# Patient Record
Sex: Male | Born: 1965 | Race: Black or African American | Hispanic: No | Marital: Married | Smoking: Former smoker
Health system: Southern US, Community
[De-identification: ages and names within clinical notes are randomized; demographics above are authoritative.]

## PROBLEM LIST (undated history)

## (undated) DIAGNOSIS — I10 Essential (primary) hypertension: Secondary | ICD-10-CM

## (undated) DIAGNOSIS — R519 Headache, unspecified: Secondary | ICD-10-CM

## (undated) DIAGNOSIS — E785 Hyperlipidemia, unspecified: Secondary | ICD-10-CM

## (undated) DIAGNOSIS — M542 Cervicalgia: Secondary | ICD-10-CM

## (undated) DIAGNOSIS — M549 Dorsalgia, unspecified: Secondary | ICD-10-CM

## (undated) DIAGNOSIS — K635 Polyp of colon: Secondary | ICD-10-CM

## (undated) DIAGNOSIS — E119 Type 2 diabetes mellitus without complications: Secondary | ICD-10-CM

## (undated) HISTORY — DX: Dorsalgia, unspecified: M54.9

## (undated) HISTORY — DX: Hyperlipidemia, unspecified: E78.5

## (undated) HISTORY — DX: Type 2 diabetes mellitus without complications: E11.9

## (undated) HISTORY — DX: Essential (primary) hypertension: I10

## (undated) HISTORY — PX: OTHER SURGICAL HISTORY: SHX169

## (undated) HISTORY — DX: Headache, unspecified: R51.9

## (undated) HISTORY — PX: COLONOSCOPY: SHX174

## (undated) HISTORY — DX: Polyp of colon: K63.5

## (undated) HISTORY — DX: Cervicalgia: M54.2

---

## 2004-05-24 ENCOUNTER — Encounter: Admission: RE | Admit: 2004-05-24 | Discharge: 2004-07-06 | Payer: Self-pay | Admitting: Internal Medicine

## 2012-10-02 DIAGNOSIS — E559 Vitamin D deficiency, unspecified: Secondary | ICD-10-CM | POA: Insufficient documentation

## 2013-01-09 DIAGNOSIS — R6882 Decreased libido: Secondary | ICD-10-CM | POA: Insufficient documentation

## 2013-01-13 DIAGNOSIS — E291 Testicular hypofunction: Secondary | ICD-10-CM | POA: Insufficient documentation

## 2013-01-15 DIAGNOSIS — E785 Hyperlipidemia, unspecified: Secondary | ICD-10-CM | POA: Insufficient documentation

## 2013-01-15 DIAGNOSIS — E1169 Type 2 diabetes mellitus with other specified complication: Secondary | ICD-10-CM | POA: Insufficient documentation

## 2013-01-15 DIAGNOSIS — I10 Essential (primary) hypertension: Secondary | ICD-10-CM | POA: Insufficient documentation

## 2013-01-15 DIAGNOSIS — Z720 Tobacco use: Secondary | ICD-10-CM | POA: Insufficient documentation

## 2013-06-25 DIAGNOSIS — N529 Male erectile dysfunction, unspecified: Secondary | ICD-10-CM | POA: Insufficient documentation

## 2014-10-20 DIAGNOSIS — M545 Low back pain, unspecified: Secondary | ICD-10-CM | POA: Insufficient documentation

## 2017-07-02 ENCOUNTER — Encounter: Payer: Self-pay | Admitting: Gastroenterology

## 2017-07-09 ENCOUNTER — Ambulatory Visit (AMBULATORY_SURGERY_CENTER): Payer: Self-pay

## 2017-07-09 VITALS — Ht 74.0 in | Wt 199.8 lb

## 2017-07-09 DIAGNOSIS — Z1211 Encounter for screening for malignant neoplasm of colon: Secondary | ICD-10-CM

## 2017-07-09 MED ORDER — NA SULFATE-K SULFATE-MG SULF 17.5-3.13-1.6 GM/177ML PO SOLN: 1.0000 | Freq: Once | ORAL | 0 refills | Status: AC

## 2017-07-09 NOTE — Progress Notes (Signed)
Denies allergies to eggs or soy products. Denies complication of anesthesia or sedation. Denies use of weight loss medication. Denies use of O2.   Emmi instructions given for colonoscopy.  

## 2017-07-10 ENCOUNTER — Encounter: Payer: Self-pay | Admitting: Gastroenterology

## 2017-07-24 ENCOUNTER — Ambulatory Visit (AMBULATORY_SURGERY_CENTER): Payer: 59 | Admitting: Gastroenterology

## 2017-07-24 ENCOUNTER — Encounter: Payer: Self-pay | Admitting: Gastroenterology

## 2017-07-24 VITALS — BP 110/69 | HR 71 | Temp 98.6°F | Resp 15 | Ht 74.0 in | Wt 199.0 lb

## 2017-07-24 DIAGNOSIS — D124 Benign neoplasm of descending colon: Secondary | ICD-10-CM | POA: Diagnosis not present

## 2017-07-24 DIAGNOSIS — Z1211 Encounter for screening for malignant neoplasm of colon: Secondary | ICD-10-CM

## 2017-07-24 MED ORDER — SODIUM CHLORIDE 0.9 % IV SOLN: 500.0000 mL | INTRAVENOUS | Status: DC

## 2017-07-24 NOTE — Progress Notes (Signed)
Report given to PACU, vss 

## 2017-07-24 NOTE — Op Note (Signed)
[place] Patient Name: Shown Dissinger Procedure Date: 07/24/2017 8:32 AM MRN: [id number] Endoscopist: Mallie Mussel L. Loletha Carrow , MD Age: 51 Referring MD:  Date of Birth: 11/01/1966 Gender: Male Account #: [id number] Procedure:                Colonoscopy Indications:              Screening for colorectal malignant neoplasm, This                            is the patient's first colonoscopy Medicines:                Monitored Anesthesia Care Procedure:                Pre-Anesthesia Assessment:                           - Prior to the procedure, a History and Physical                            was performed, and patient medications and                            allergies were reviewed. The patient's tolerance of                            previous anesthesia was also reviewed. The risks                            and benefits of the procedure and the sedation                            options and risks were discussed with the patient.                            All questions were answered, and informed consent                            was obtained. Prior Anticoagulants: The patient has                            taken no previous anticoagulant or antiplatelet                            agents. ASA Grade Assessment: II - A patient with                            mild systemic disease. After reviewing the risks                            and benefits, the patient was deemed in                            satisfactory condition to undergo the procedure.  After obtaining informed consent, the colonoscope                            was passed under direct vision. Throughout the                            procedure, the patient's blood pressure, pulse, and                            oxygen saturations were monitored continuously. The                            Colonoscope was introduced through the anus and                            advanced to the the  terminal ileum. The colonoscopy                            was performed without difficulty. The patient                            tolerated the procedure well. The quality of the                            bowel preparation was good. The terminal ileum,                            ileocecal valve, appendiceal orifice, and rectum                            were photographed. The quality of the bowel                            preparation was evaluated using the BBPS [facility]                            Bowel Preparation Scale) with scores of: Right                            Colon = 2, Transverse Colon = 2 and Left Colon = 2.                            The total BBPS score equals 6. The bowel                            preparation used was SUPREP. Scope In: 8:34:34 AM Scope Out: 8:51:30 AM Scope Withdrawal Time: 0 hours 14 minutes 44 seconds  Total Procedure Duration: 0 hours 16 minutes 56 seconds  Findings:                 The digital rectal exam findings include anal                            stricture. This  precluded complete prostate exam.                           The terminal ileum appeared normal.                           A 2 mm polyp was found in the proximal descending                            colon. The polyp was sessile. The polyp was removed                            with a cold snare. Resection and retrieval were                            complete.                           The exam was otherwise without abnormality on                            direct and retroflexion views. Complications:            No immediate complications. Estimated Blood Loss:     Estimated blood loss: none. Impression:               - Anal stricture found on digital rectal exam.                           - The examined portion of the ileum was normal.                           - One 2 mm polyp in the proximal descending colon,                            removed with a cold snare. Resected and  retrieved.                           - The examination was otherwise normal on direct                            and retroflexion views. Recommendation:           - Patient has a contact number available for                            emergencies. The signs and symptoms of potential                            delayed complications were discussed with the                            patient. Return to normal activities tomorrow.  Written discharge instructions were provided to the                            patient.                           - Resume previous diet.                           - Continue present medications.                           - Await pathology results.                           - Repeat colonoscopy is recommended for                            surveillance. The colonoscopy date will be                            determined after pathology results from today's                            exam become available for review. Henry L. Loletha Carrow, MD 07/24/2017 8:55:44 AM This report has been signed electronically.

## 2017-07-24 NOTE — Progress Notes (Signed)
Called to room to assist during endoscopic procedure.  Patient ID and intended procedure confirmed with present staff. Received instructions for my participation in the procedure from the performing physician.  

## 2017-07-24 NOTE — Progress Notes (Signed)
Pt's states no medical or surgical changes since previsit or office visit. 

## 2017-07-24 NOTE — Patient Instructions (Signed)
Impression/Recommendations:  Polyp handout given to patient,  Resume previous diet. Continue present medications.  Repeat colonoscopy recommended for surveillance.  Date to be determined based on pathology results.  YOU HAD AN ENDOSCOPIC PROCEDURE TODAY AT [facility]:   Refer to the procedure report that was given to you for any specific questions about what was found during the examination.  If the procedure report does not answer your questions, please call your gastroenterologist to clarify.  If you requested that your care partner not be given the details of your procedure findings, then the procedure report has been included in a sealed envelope for you to review at your convenience later.  YOU SHOULD EXPECT: Some feelings of bloating in the abdomen. Passage of more gas than usual.  Walking can help get rid of the air that was put into your GI tract during the procedure and reduce the bloating. If you had a lower endoscopy (such as a colonoscopy or flexible sigmoidoscopy) you may notice spotting of blood in your stool or on the toilet paper. If you underwent a bowel prep for your procedure, you may not have a normal bowel movement for a few days.  Please Note:  You might notice some irritation and congestion in your nose or some drainage.  This is from the oxygen used during your procedure.  There is no need for concern and it should clear up in a day or so.  SYMPTOMS TO REPORT IMMEDIATELY:   Following lower endoscopy (colonoscopy or flexible sigmoidoscopy):  Excessive amounts of blood in the stool  Significant tenderness or worsening of abdominal pains  Swelling of the abdomen that is new, acute  Fever of 100F or higher  For urgent or emergent issues, a gastroenterologist can be reached at any hour by calling [phone].   DIET:  We do recommend a small meal at first, but then you may proceed to your regular diet.  Drink plenty of fluids but you should avoid  alcoholic beverages for 24 hours.  ACTIVITY:  You should plan to take it easy for the rest of today and you should NOT DRIVE or use heavy machinery until tomorrow (because of the sedation medicines used during the test).    FOLLOW UP: Our staff will call the number listed on your records the next business day following your procedure to check on you and address any questions or concerns that you may have regarding the information given to you following your procedure. If we do not reach you, we will leave a message.  However, if you are feeling well and you are not experiencing any problems, there is no need to return our call.  We will assume that you have returned to your regular daily activities without incident.  If any biopsies were taken you will be contacted by phone or by letter within the next 1-3 weeks.  Please call us at [phone] if you have not heard about the biopsies in 3 weeks.    SIGNATURES/CONFIDENTIALITY: You and/or your care partner have signed paperwork which will be entered into your electronic medical record.  These signatures attest to the fact that that the information above on your After Visit Summary has been reviewed and is understood.  Full responsibility of the confidentiality of this discharge information lies with you and/or your care-partner.

## 2017-07-25 ENCOUNTER — Telehealth: Payer: Self-pay

## 2017-07-25 ENCOUNTER — Telehealth: Payer: Self-pay | Admitting: *Deleted

## 2017-07-25 NOTE — Telephone Encounter (Signed)
No answer, message left for the patient. 

## 2017-07-25 NOTE — Telephone Encounter (Signed)
Left message on answering machine. 

## 2017-07-26 ENCOUNTER — Encounter: Payer: Self-pay | Admitting: Gastroenterology

## 2019-06-22 ENCOUNTER — Emergency Department (HOSPITAL_COMMUNITY): Payer: Managed Care, Other (non HMO)

## 2019-06-22 ENCOUNTER — Encounter (HOSPITAL_COMMUNITY): Payer: Self-pay | Admitting: *Deleted

## 2019-06-22 ENCOUNTER — Emergency Department (HOSPITAL_COMMUNITY)
Admission: EM | Admit: 2019-06-22 | Discharge: 2019-06-22 | Disposition: A | Discharge status: Home / Self Care | Payer: Managed Care, Other (non HMO) | Attending: Emergency Medicine | Admitting: Emergency Medicine

## 2019-06-22 DIAGNOSIS — I10 Essential (primary) hypertension: Secondary | ICD-10-CM | POA: Insufficient documentation

## 2019-06-22 DIAGNOSIS — S20219A Contusion of unspecified front wall of thorax, initial encounter: Secondary | ICD-10-CM | POA: Diagnosis not present

## 2019-06-22 DIAGNOSIS — Y929 Unspecified place or not applicable: Secondary | ICD-10-CM | POA: Insufficient documentation

## 2019-06-22 DIAGNOSIS — S161XXA Strain of muscle, fascia and tendon at neck level, initial encounter: Secondary | ICD-10-CM | POA: Diagnosis not present

## 2019-06-22 DIAGNOSIS — M7918 Myalgia, other site: Secondary | ICD-10-CM

## 2019-06-22 DIAGNOSIS — E119 Type 2 diabetes mellitus without complications: Secondary | ICD-10-CM | POA: Diagnosis not present

## 2019-06-22 DIAGNOSIS — Z7984 Long term (current) use of oral hypoglycemic drugs: Secondary | ICD-10-CM | POA: Insufficient documentation

## 2019-06-22 DIAGNOSIS — Z720 Tobacco use: Secondary | ICD-10-CM | POA: Diagnosis not present

## 2019-06-22 DIAGNOSIS — Y939 Activity, unspecified: Secondary | ICD-10-CM | POA: Diagnosis not present

## 2019-06-22 DIAGNOSIS — S6010XA Contusion of unspecified finger with damage to nail, initial encounter: Secondary | ICD-10-CM

## 2019-06-22 DIAGNOSIS — S60132A Contusion of left middle finger with damage to nail, initial encounter: Secondary | ICD-10-CM | POA: Insufficient documentation

## 2019-06-22 DIAGNOSIS — S5012XA Contusion of left forearm, initial encounter: Secondary | ICD-10-CM | POA: Insufficient documentation

## 2019-06-22 DIAGNOSIS — S80812A Abrasion, left lower leg, initial encounter: Secondary | ICD-10-CM | POA: Insufficient documentation

## 2019-06-22 DIAGNOSIS — Y999 Unspecified external cause status: Secondary | ICD-10-CM | POA: Insufficient documentation

## 2019-06-22 DIAGNOSIS — R51 Headache: Secondary | ICD-10-CM | POA: Insufficient documentation

## 2019-06-22 DIAGNOSIS — R0789 Other chest pain: Secondary | ICD-10-CM | POA: Diagnosis not present

## 2019-06-22 DIAGNOSIS — T07XXXA Unspecified multiple injuries, initial encounter: Secondary | ICD-10-CM | POA: Diagnosis present

## 2019-06-22 MED ORDER — CEPHALEXIN 500 MG PO CAPS: 500.0000 mg | ORAL_CAPSULE | Freq: Four times a day (QID) | ORAL | 0 refills | Status: DC

## 2019-06-22 MED ORDER — METHOCARBAMOL 500 MG PO TABS: 500.0000 mg | ORAL_TABLET | Freq: Two times a day (BID) | ORAL | 0 refills | Status: AC
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
## 2019-08-01 DIAGNOSIS — M79645 Pain in left finger(s): Secondary | ICD-10-CM | POA: Insufficient documentation

## 2019-08-02 DIAGNOSIS — L089 Local infection of the skin and subcutaneous tissue, unspecified: Secondary | ICD-10-CM | POA: Insufficient documentation

## 2019-08-18 ENCOUNTER — Encounter: Payer: Self-pay | Admitting: *Deleted

## 2019-08-18 ENCOUNTER — Encounter: Payer: Self-pay | Admitting: Neurology

## 2019-08-18 ENCOUNTER — Ambulatory Visit: Payer: Managed Care, Other (non HMO) | Admitting: Neurology

## 2019-08-18 ENCOUNTER — Other Ambulatory Visit: Payer: Self-pay

## 2019-08-18 VITALS — BP 140/64 | HR 76 | Temp 97.5°F | Ht 75.0 in | Wt 201.5 lb

## 2019-08-18 DIAGNOSIS — R51 Headache: Secondary | ICD-10-CM | POA: Diagnosis not present

## 2019-08-18 DIAGNOSIS — Z87828 Personal history of other (healed) physical injury and trauma: Secondary | ICD-10-CM | POA: Insufficient documentation

## 2019-08-18 DIAGNOSIS — G8929 Other chronic pain: Secondary | ICD-10-CM | POA: Insufficient documentation

## 2019-08-18 MED ORDER — NORTRIPTYLINE HCL 10 MG PO CAPS: 20.0000 mg | ORAL_CAPSULE | Freq: Every day | ORAL | 11 refills | Status: DC
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
## 2019-09-22 DIAGNOSIS — Z4789 Encounter for other orthopedic aftercare: Secondary | ICD-10-CM | POA: Insufficient documentation
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
## 2019-10-14 ENCOUNTER — Other Ambulatory Visit: Payer: Self-pay

## 2019-10-14 ENCOUNTER — Encounter: Payer: Self-pay | Admitting: Neurology

## 2019-10-14 ENCOUNTER — Ambulatory Visit: Payer: Managed Care, Other (non HMO) | Admitting: Neurology

## 2019-10-14 VITALS — BP 124/72 | HR 72 | Temp 97.3°F | Ht 74.0 in | Wt 198.6 lb

## 2019-10-14 DIAGNOSIS — G8929 Other chronic pain: Secondary | ICD-10-CM | POA: Diagnosis not present

## 2019-10-14 DIAGNOSIS — R519 Headache, unspecified: Secondary | ICD-10-CM

## 2019-10-14 NOTE — Patient Instructions (Signed)
Please call is you need [place] or have recurrent headache. I am glad you have been doing well.

## 2019-11-05 NOTE — Progress Notes (Signed)
I have reviewed and agreed above plan. 

## 2020-05-11 ENCOUNTER — Ambulatory Visit: Payer: Self-pay | Admitting: Surgery

## 2020-05-11 NOTE — H&P (Signed)
Ryan Chandler Appointment: 05/11/2020 1:50 PM Location: [facility] Patient #: [id number] DOB: 05/20/1966 Married / Language: English / Race: Black or African American Male  History of Present Illness Ryan Chandler A. Cydnie Deason MD; 05/11/2020 5:11 PM) Patient words: Patient presents for follow-up of a left perineal sebaceous cyst. He was seen a few weeks ago for an infected epidermal inclusion cyst left perineum. This is resolved with the cyst remains he desires excision.  The patient is a 54 year old male.   Allergies (Chanel Teressa Senter, CMA; 05/11/2020 1:59 PM) No Known Drug Allergies [04/07/2020]: Allergies Reconciled  Medication History (Chanel Teressa Senter, CMA; 05/11/2020 2:00 PM) Semaglutide (3MG  Tablet, Oral) Active. metFORMIN HCl (1000MG  Tablet, Oral) Active. Lisinopril (5MG  Tablet, Oral) Active. Simvastatin (40MG  Tablet, Oral) Active. Medications Reconciled    Vitals (Chanel Nolan CMA; 05/11/2020 2:00 PM) 05/11/2020 2:00 PM Weight: 202.38 lb Height: 74in Body Surface Area: 2.18 m Body Mass Index: 25.98 kg/m  Temp.: 97.91F  Pulse: 90 (Regular)  BP: 130/74(Sitting, Left Arm, Standard)        Physical Exam (Josephus Harriger A. Audie Wieser MD; 05/11/2020 5:12 PM)  Integumentary Note: Left perineum is a 3 x 2 cm mass consistent with epidermal inclusion cyst lateral to the hemiscrotum on the left side. Best seen in lithotomy.  Head and Neck Head-normocephalic, atraumatic with no lesions or palpable masses.  Eye Eyeball - Bilateral-Extraocular movements intact. Sclera/Conjunctiva - Bilateral-No scleral icterus.  Cardiovascular Cardiovascular examination reveals -on palpation PMI is normal in location and amplitude, no palpable S3 or S4. Normal cardiac borders., normal heart sounds, regular rate and rhythm with no murmurs, carotid auscultation reveals no bruits and normal pedal pulses bilaterally.  Neurologic Neurologic evaluation reveals -alert and  oriented x 3 with no impairment of recent or remote memory. Mental Status-Normal.  Musculoskeletal Normal Exam - Left-Upper Extremity Strength Normal and Lower Extremity Strength Normal. Normal Exam - Right-Upper Extremity Strength Normal, Lower Extremity Weakness.    Assessment & Plan (Sanora Cunanan A. Marcia Hartwell MD; 05/11/2020 5:12 PM)  SEBACEOUS CYST (L72.3) Impression: Left perineum without signs of infection He desires excision. Risk of bleeding, infection, open wound, wound complications, recurrence, anesthesia risks, and observation discussed. He would like to proceed with excision of 3 cm left perineal cyst  20 minutes time  Current Plans Pt Education - [facility] Education - Written Instructions given: discussed with patient and provided information. Pt Education - [facility] Free Text Education/Instructions: discussed with patient and provided information.

## 2020-10-20 DIAGNOSIS — C801 Malignant (primary) neoplasm, unspecified: Secondary | ICD-10-CM

## 2020-10-20 HISTORY — DX: Malignant (primary) neoplasm, unspecified: C80.1

## 2020-11-02 ENCOUNTER — Other Ambulatory Visit: Payer: Self-pay | Admitting: Orthopedic Surgery

## 2020-11-02 ENCOUNTER — Other Ambulatory Visit: Payer: Self-pay | Admitting: Cardiology

## 2020-11-02 DIAGNOSIS — M67912 Unspecified disorder of synovium and tendon, left shoulder: Secondary | ICD-10-CM

## 2020-11-07 ENCOUNTER — Ambulatory Visit
Admission: RE | Admit: 2020-11-07 | Discharge: 2020-11-07 | Disposition: A | Discharge status: Home / Self Care | Payer: Managed Care, Other (non HMO) | Source: Ambulatory Visit | Attending: Orthopedic Surgery | Admitting: Orthopedic Surgery

## 2020-11-07 DIAGNOSIS — M67912 Unspecified disorder of synovium and tendon, left shoulder: Secondary | ICD-10-CM

## 2020-11-11 DIAGNOSIS — R2232 Localized swelling, mass and lump, left upper limb: Secondary | ICD-10-CM | POA: Insufficient documentation

## 2020-12-01 DIAGNOSIS — R590 Localized enlarged lymph nodes: Secondary | ICD-10-CM | POA: Insufficient documentation

## 2020-12-01 DIAGNOSIS — M25519 Pain in unspecified shoulder: Secondary | ICD-10-CM | POA: Insufficient documentation

## 2020-12-07 ENCOUNTER — Other Ambulatory Visit: Payer: Self-pay | Admitting: Oncology

## 2020-12-14 ENCOUNTER — Telehealth: Payer: Self-pay | Admitting: Oncology

## 2020-12-14 NOTE — Telephone Encounter (Signed)
Received a call from Camillia Herter, RN navigator from [place] to schedule a new pt appt for Mr. Ryan Chandler w/a dx of melanoma. Pt has been scheduled to see Dr. [name] on 1/28 at 2pm. Jeannene Patella will notify the pt w/ the appt date and time.

## 2020-12-17 ENCOUNTER — Inpatient Hospital Stay: Payer: Managed Care, Other (non HMO) | Admitting: Oncology

## 2020-12-21 ENCOUNTER — Telehealth: Payer: Self-pay | Admitting: Oncology

## 2020-12-21 NOTE — Telephone Encounter (Signed)
Received a new pt referral from Dr. Brantley Stage for metastatic melanoma. Ryan Chandler has been cld and scheduled to see Dr. [name] on 2/10 at 2pm. Pt aware to arrive 20 minutes early.

## 2020-12-30 ENCOUNTER — Other Ambulatory Visit: Payer: Self-pay

## 2020-12-30 ENCOUNTER — Inpatient Hospital Stay: Payer: Managed Care, Other (non HMO) | Attending: Oncology | Admitting: Oncology

## 2020-12-30 VITALS — BP 144/73 | HR 77 | Temp 97.8°F | Resp 18 | Ht 74.0 in | Wt 199.8 lb

## 2020-12-30 DIAGNOSIS — E785 Hyperlipidemia, unspecified: Secondary | ICD-10-CM | POA: Insufficient documentation

## 2020-12-30 DIAGNOSIS — E079 Disorder of thyroid, unspecified: Secondary | ICD-10-CM | POA: Insufficient documentation

## 2020-12-30 DIAGNOSIS — Z7984 Long term (current) use of oral hypoglycemic drugs: Secondary | ICD-10-CM | POA: Insufficient documentation

## 2020-12-30 DIAGNOSIS — Z7189 Other specified counseling: Secondary | ICD-10-CM | POA: Diagnosis not present

## 2020-12-30 DIAGNOSIS — C439 Malignant melanoma of skin, unspecified: Secondary | ICD-10-CM | POA: Diagnosis not present

## 2020-12-30 DIAGNOSIS — Z87891 Personal history of nicotine dependence: Secondary | ICD-10-CM | POA: Insufficient documentation

## 2020-12-30 DIAGNOSIS — Z79899 Other long term (current) drug therapy: Secondary | ICD-10-CM | POA: Insufficient documentation

## 2020-12-30 DIAGNOSIS — K529 Noninfective gastroenteritis and colitis, unspecified: Secondary | ICD-10-CM | POA: Insufficient documentation

## 2020-12-30 DIAGNOSIS — E119 Type 2 diabetes mellitus without complications: Secondary | ICD-10-CM | POA: Insufficient documentation

## 2020-12-30 DIAGNOSIS — Z5112 Encounter for antineoplastic immunotherapy: Secondary | ICD-10-CM | POA: Insufficient documentation

## 2020-12-30 DIAGNOSIS — I10 Essential (primary) hypertension: Secondary | ICD-10-CM | POA: Insufficient documentation

## 2020-12-30 MED ORDER — PROCHLORPERAZINE MALEATE 10 MG PO TABS: 10.0000 mg | ORAL_TABLET | Freq: Four times a day (QID) | ORAL | 0 refills | Status: AC | PRN

## 2020-12-30 NOTE — Progress Notes (Signed)
Reason for the request:    Melanoma  HPI: I was asked by Dr. Brantley Stage to evaluate Ryan Chandler for the diagnosis of melanoma.  He is a 55 year old man with diabetes but no other significant comorbid conditions.  He had presented with shoulder pain in December 2021.  At that time he had an MRI to evaluate that and found to have a large mass in the axilla that is consistent with a malignancy.  The mass measuring 7.2 x 4.7 x 7 cm.  Based on these findings, he was evaluated at [facility] cardiothoracic surgery and underwent biopsy and a PET scan.  Ultrasound-guided biopsy obtained on December 07, 2020 with the results showing metastatic melanoma.  PET CT scan obtained on December 14, 2020 showed a left axillary nodal mass compatible with nodal metastasis of lymphoma.  No evidence of distant metastasis noted.  He was evaluated by Dr. Brantley Stage and was referred to me for evaluation regarding preoperative systemic treatment.    Clinically, he reports of feeling reasonably well without any complaints at this time.  He continues to work full-time without any complications at this time.  He denies any skin rashes or lesions although he did have a discoloration under his nail bed and his nail was removed by dermatology in the last year.  No evidence of any other skin cancer noted previously.   He does not report any headaches, blurry vision, syncope or seizures. Does not report any fevers, chills or sweats.  Does not report any cough, wheezing or hemoptysis.  Does not report any chest pain, palpitation, orthopnea or leg edema.  Does not report any nausea, vomiting or abdominal pain.  Does not report any constipation or diarrhea.  Does not report any skeletal complaints.    Does not report frequency, urgency or hematuria.  Does not report any skin rashes or lesions. Does not report any heat or cold intolerance.  Does not report any lymphadenopathy or petechiae.  Does not report any anxiety or  depression.  Remaining review of systems is negative.    Past Medical History:  Diagnosis Date  . Back pain   . Diabetes mellitus without complication ([place])   . Headache   . Hyperlipidemia   . Hypertension   . Neck pain   :  Past Surgical History:  Procedure Laterality Date  . no past surgery    :   Current Outpatient Medications:  .  empagliflozin (JARDIANCE) 25 MG TABS tablet, Take 25 mg by mouth daily., Disp: , Rfl:  .  lisinopril (PRINIVIL,ZESTRIL) 5 MG tablet, Take 5 mg by mouth daily., Disp: , Rfl:  .  metFORMIN (GLUCOPHAGE) 1000 MG tablet, Take 1,000 mg by mouth 2 (two) times daily., Disp: , Rfl:  .  simvastatin (ZOCOR) 40 MG tablet, Take 40 mg by mouth daily., Disp: , Rfl: :  No Known Allergies:  Family History  Problem Relation Age of Onset  . Diabetes Mother   . Parkinson's disease Father   . Colon cancer Neg Hx   . Esophageal cancer Neg Hx   . Pancreatic cancer Neg Hx   . Rectal cancer Neg Hx   . Stomach cancer Neg Hx   :  Social History   Socioeconomic History  . Marital status: Married    Spouse name: Not on file  . Number of children: 5  . Years of education: college  . Highest education level: Not on file  Occupational History  . 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 Smoking status: Former Research scientist (life sciences)  . Smokeless tobacco: Former Network engineer  . Vaping Use: Never used  Substance and Sexual Activity  . Alcohol use: No  . Drug use: No  . Sexual activity: Not on file  Other Topics Concern  . Not on file  Social History Narrative   Lives at home with his family.   No daily caffeine use.   Right-handed.   Social Determinants of Health   Financial Resource Strain: Not on file  Food Insecurity: Not on file  Transportation Needs: Not on file  Physical Activity: Not on file  Stress: Not on file  Social Connections: Not on file  Intimate Partner Violence: Not on file  :  Pertinent items are noted in  HPI.  Exam: Blood pressure (!) 144/73, pulse 77, temperature 97.8 F (36.6 C), temperature source Tympanic, resp. rate 18, height 6\' 2"  (1.88 m), weight 199 lb 12.8 oz (90.6 kg), SpO2 100 %.  ECOG 0  General appearance: alert and cooperative appeared without distress. Head: atraumatic without any abnormalities. Eyes: conjunctivae/corneas clear. PERRL.  Sclera anicteric. Throat: lips, mucosa, and tongue normal; without oral thrush or ulcers. Resp: clear to auscultation bilaterally without rhonchi, wheezes or dullness to percussion. Cardio: regular rate and rhythm, S1, S2 normal, no murmur, click, rub or gallop GI: soft, non-tender; bowel sounds normal; no masses,  no organomegaly Skin: Discoloration and noted the left middle finger nailbed. Lymph nodes: Bulky palpable left axillary adenopathy. Neurologic: Grossly normal without any motor, sensory or deep tendon reflexes. Musculoskeletal: No joint deformity or effusion.    Assessment and Plan:   55 year old man with:  1.  Stage III melanoma of unknown primary diagnosed in January 2022 after presenting with a bulky left axillary lymph node involvement.  He was found to have a nodal mass measuring 7.2 x 4.7 x 7 cm that is PET avid without any evidence of metastatic disease.  Tissue biopsy confirmed the presence of metastatic melanoma without any clear-cut primary.  He did have a subungual pigmentation although removed by dermatology without evidence of melanoma in the last year.  Treatment options moving forward were discussed at this time.  Proceeding with neoadjuvant systemic treatment is a reasonable approach given the bulk of his disease.  Single agent Pembrolizumab versus treatment with ipilimumab and nivolumab were reviewed at this time.  Given the high objective response rates associated with combination I have opted to proceed with combination therapy.  Complication with this therapy include nausea, fatigue, immune mediated  complications were discussed.  These include pneumonitis, colitis or thyroid disease, hepatitis and hypophysitis.  Given his age and excellent performance status aggressive measures are warranted and is agreeable to proceed with combination immunotherapy.  Upon completing 2-4 cycles of therapy will reassess his response for potentially resecting his tumor.  Maintenance therapy with monthly nivolumab could be needed for maintenance purposes in the future.  After discussion today he is agreeable to proceed after therapy education class.  2.  IV access: Peripheral veins will be in use at this time.  3.  Immune mediated complications: We will continue to educate him about pneumonitis, colitis and thyroid disease.  4.  Antiemetics: Prescription for Compazine will be available to him.  5.  Follow-up: We will be in the immediate future to start therapy.  60  minutes were dedicated to this visit. The time was spent on reviewing laboratory data, imaging studies, discussing treatment options, reviewing pathology results and  answering questions regarding future plan.      A copy of this consult has been forwarded to the requesting physician.

## 2020-12-30 NOTE — Progress Notes (Signed)
START ON PATHWAY REGIMEN - Melanoma and Other Skin Cancers     A cycle is every 21 days:     Nivolumab      Ipilimumab    A cycle is every 14 days:     Nivolumab   **Always confirm dose/schedule in your pharmacy ordering system**  Patient Characteristics: Melanoma, Cutaneous/Unknown Primary, Preoperative or Nonsurgical Candidate (Clinical Staging), Any cT, cN+, Unresectable, Systemic Therapy Indicated, BRAF V600 Wild Type / BRAF V600 Results Pending or Unknown Disease Classification: Melanoma Disease Subtype: Cutaneous BRAF V600 Mutation Status: Did Not Order BRAF V600 Test Therapeutic Status: Preoperative or Nonsurgical Candidate (Clinical Staging) AJCC T Category: cTX AJCC N Category: cN3c AJCC M Category: cM0 AJCC 8 Stage Grouping: III Type of Therapy: Systemic Therapy Indicated Intent of Therapy: Curative Intent, Discussed with Patient

## 2020-12-31 ENCOUNTER — Telehealth: Payer: Self-pay | Admitting: Oncology

## 2020-12-31 NOTE — Telephone Encounter (Signed)
Scheduled per 02/10 los, patient has been called and notified of upcoming appointments. °

## 2021-01-03 ENCOUNTER — Inpatient Hospital Stay: Payer: Managed Care, Other (non HMO)

## 2021-01-03 ENCOUNTER — Encounter: Payer: Self-pay | Admitting: Oncology

## 2021-01-03 ENCOUNTER — Telehealth: Payer: Self-pay | Admitting: Oncology

## 2021-01-03 ENCOUNTER — Other Ambulatory Visit: Payer: Self-pay

## 2021-01-03 NOTE — Telephone Encounter (Signed)
Scheduled per 02/11 los, patient has been called and notified. 

## 2021-01-03 NOTE — Progress Notes (Signed)
Met with patient/accompanying adult at registration to introduce myself as Arboriculturist and to offer available resources.  Discussed one-time $1000 [employer] grant to assist with personal expenses while going through treatment.  Gave him my card if interested in applying and for any additional financial questions or concerns.

## 2021-01-07 ENCOUNTER — Inpatient Hospital Stay: Payer: Managed Care, Other (non HMO)

## 2021-01-07 NOTE — Progress Notes (Signed)
Pharmacist Chemotherapy Monitoring - Initial Assessment    Anticipated start date: 01/14/2021   Regimen:  . Are orders appropriate based on the patient's diagnosis, regimen, and cycle? Yes . Does the plan date match the patient's scheduled date? Yes . Is the sequencing of drugs appropriate? Yes . Are the premedications appropriate for the patient's regimen? Yes . Prior Authorization for treatment is: Pending o If applicable, is the correct biosimilar selected based on the patient's insurance? not applicable  Organ Function and Labs: Marland Kitchen Are dose adjustments needed based on the patient's renal function, hepatic function, or hematologic function? Yes . Are appropriate labs ordered prior to the start of patient's treatment? Yes . Other organ system assessment, if indicated: N/A . The following baseline labs, if indicated, have been ordered: ipilimumab: baseline TSH +/- T4 and nivolumab: baseline TSH +/- T4  Dose Assessment: . Are the drug doses appropriate? Yes . Are the following correct: o Drug concentrations Yes o IV fluid compatible with drug Yes o Administration routes Yes o Timing of therapy Yes . If applicable, does the patient have documented access for treatment and/or plans for port-a-cath placement? not applicable . If applicable, have lifetime cumulative doses been properly documented and assessed? yes Lifetime Dose Tracking  No doses have been documented on this patient for the following tracked chemicals: Doxorubicin, Epirubicin, Idarubicin, Daunorubicin, Mitoxantrone, Bleomycin, Oxaliplatin, Carboplatin, Liposomal Doxorubicin  o   Toxicity Monitoring/Prevention: . The patient has the following take home antiemetics prescribed: Prochlorperazine . The patient has the following take home medications prescribed: N/A . Medication allergies and previous infusion related reactions, if applicable, have been reviewed and addressed. No . The patient's current medication list has  been assessed for drug-drug interactions with their chemotherapy regimen. no significant drug-drug interactions were identified on review.  Order Review: . Are the treatment plan orders signed? Yes . Is the patient scheduled to see a provider prior to their treatment? No  I verify that I have reviewed each item in the above checklist and answered each question accordingly.  Larene Beach, RPH, 01/07/2021  1:53 PM

## 2021-01-14 ENCOUNTER — Encounter: Payer: Self-pay | Admitting: Oncology

## 2021-01-14 ENCOUNTER — Inpatient Hospital Stay: Payer: Managed Care, Other (non HMO)

## 2021-01-14 ENCOUNTER — Other Ambulatory Visit: Payer: Self-pay

## 2021-01-14 VITALS — BP 150/83 | HR 72 | Temp 98.2°F | Resp 18

## 2021-01-14 DIAGNOSIS — I10 Essential (primary) hypertension: Secondary | ICD-10-CM | POA: Diagnosis not present

## 2021-01-14 DIAGNOSIS — Z794 Long term (current) use of insulin: Secondary | ICD-10-CM | POA: Insufficient documentation

## 2021-01-14 DIAGNOSIS — E079 Disorder of thyroid, unspecified: Secondary | ICD-10-CM | POA: Diagnosis not present

## 2021-01-14 DIAGNOSIS — C439 Malignant melanoma of skin, unspecified: Secondary | ICD-10-CM | POA: Diagnosis present

## 2021-01-14 DIAGNOSIS — K529 Noninfective gastroenteritis and colitis, unspecified: Secondary | ICD-10-CM | POA: Diagnosis not present

## 2021-01-14 DIAGNOSIS — Z7984 Long term (current) use of oral hypoglycemic drugs: Secondary | ICD-10-CM | POA: Diagnosis not present

## 2021-01-14 DIAGNOSIS — Z87891 Personal history of nicotine dependence: Secondary | ICD-10-CM | POA: Diagnosis not present

## 2021-01-14 DIAGNOSIS — Z5112 Encounter for antineoplastic immunotherapy: Secondary | ICD-10-CM | POA: Diagnosis not present

## 2021-01-14 DIAGNOSIS — E785 Hyperlipidemia, unspecified: Secondary | ICD-10-CM | POA: Diagnosis not present

## 2021-01-14 DIAGNOSIS — Z79899 Other long term (current) drug therapy: Secondary | ICD-10-CM | POA: Diagnosis not present

## 2021-01-14 DIAGNOSIS — E1165 Type 2 diabetes mellitus with hyperglycemia: Secondary | ICD-10-CM | POA: Insufficient documentation

## 2021-01-14 DIAGNOSIS — E119 Type 2 diabetes mellitus without complications: Secondary | ICD-10-CM | POA: Diagnosis not present

## 2021-01-14 LAB — CBC WITH DIFFERENTIAL (CANCER CENTER ONLY)
Abs Immature Granulocytes: 0.02 10*3/uL (ref 0.00–0.07)
Basophils Absolute: 0 10*3/uL (ref 0.0–0.1)
Basophils Relative: 0 %
Eosinophils Absolute: 0.3 10*3/uL (ref 0.0–0.5)
Eosinophils Relative: 5 %
HCT: 44.4 % (ref 39.0–52.0)
Hemoglobin: 14.7 g/dL (ref 13.0–17.0)
Immature Granulocytes: 0 %
Lymphocytes Relative: 23 %
Lymphs Abs: 1.1 10*3/uL (ref 0.7–4.0)
MCH: 28.2 pg (ref 26.0–34.0)
MCHC: 33.1 g/dL (ref 30.0–36.0)
MCV: 85.1 fL (ref 80.0–100.0)
Monocytes Absolute: 0.4 10*3/uL (ref 0.1–1.0)
Monocytes Relative: 9 %
Neutro Abs: 2.9 10*3/uL (ref 1.7–7.7)
Neutrophils Relative %: 63 %
Platelet Count: 215 10*3/uL (ref 150–400)
RBC: 5.22 MIL/uL (ref 4.22–5.81)
RDW: 12.5 % (ref 11.5–15.5)
WBC Count: 4.7 10*3/uL (ref 4.0–10.5)
nRBC: 0 % (ref 0.0–0.2)

## 2021-01-14 LAB — CMP (CANCER CENTER ONLY)
ALT: 11 U/L (ref 0–44)
AST: 9 U/L — ABNORMAL LOW (ref 15–41)
Albumin: 3.8 g/dL (ref 3.5–5.0)
Alkaline Phosphatase: 70 U/L (ref 38–126)
Anion gap: 10 (ref 5–15)
BUN: 7 mg/dL (ref 6–20)
CO2: 26 mmol/L (ref 22–32)
Calcium: 9.3 mg/dL (ref 8.9–10.3)
Chloride: 101 mmol/L (ref 98–111)
Creatinine: 0.78 mg/dL (ref 0.61–1.24)
GFR, Estimated: >60 mL/min (ref >60)
Glucose, Bld: 259 mg/dL — ABNORMAL HIGH (ref 70–99)
Potassium: 3.9 mmol/L (ref 3.5–5.1)
Sodium: 137 mmol/L (ref 135–145)
Total Bilirubin: 0.4 mg/dL (ref 0.3–1.2)
Total Protein: 7.3 g/dL (ref 6.5–8.1)

## 2021-01-14 LAB — TSH: TSH: 1.176 u[IU]/mL (ref 0.320–4.118)

## 2021-01-14 MED ORDER — SODIUM CHLORIDE 0.9 % IV SOLN
3.0000 mg/kg | Freq: Once | INTRAVENOUS | Status: AC
  Administered 2021-01-14: 272 mg via INTRAVENOUS
  Filled 2021-01-14: qty 24

## 2021-01-14 MED ORDER — DIPHENHYDRAMINE HCL 50 MG/ML IJ SOLN
INTRAMUSCULAR | Status: AC
  Filled 2021-01-14: qty 1

## 2021-01-14 MED ORDER — DIPHENHYDRAMINE HCL 50 MG/ML IJ SOLN
25.0000 mg | Freq: Once | INTRAMUSCULAR | Status: AC
  Administered 2021-01-14: 25 mg via INTRAVENOUS

## 2021-01-14 MED ORDER — SODIUM CHLORIDE 0.9 % IV SOLN
Freq: Once | INTRAVENOUS | Status: AC
  Filled 2021-01-14: qty 250

## 2021-01-14 MED ORDER — SODIUM CHLORIDE 0.9 % IV SOLN
1.0000 mg/kg | Freq: Once | INTRAVENOUS | Status: AC
  Administered 2021-01-14: 100 mg via INTRAVENOUS
  Filled 2021-01-14: qty 20

## 2021-01-14 MED ORDER — FAMOTIDINE IN NACL 20-0.9 MG/50ML-% IV SOLN
20.0000 mg | Freq: Once | INTRAVENOUS | Status: AC
Start: 2021-01-14 — End: 2021-01-14
  Administered 2021-01-14: 20 mg via INTRAVENOUS

## 2021-01-14 MED ORDER — FAMOTIDINE IN NACL 20-0.9 MG/50ML-% IV SOLN
INTRAVENOUS | Status: AC
  Filled 2021-01-14: qty 50

## 2021-01-14 NOTE — Progress Notes (Signed)
Met with patient at registration whom wanted to speak with me to apply for [employer]. Advised him what is needed to apply. He forgot to bring his income documentation. Advised he can bring back at a later date and time before next treatment.  Advised him to contact me to schedule appointment. He has my card to do so and for any additional financial questions or concerns.

## 2021-01-14 NOTE — Progress Notes (Signed)

## 2021-01-14 NOTE — Patient Instructions (Addendum)
[facility] Discharge Instructions for Patients Receiving Chemotherapy  Today you received the following chemotherapy agents Opdivo and Yerovy   To help prevent nausea and vomiting after your treatment, we encourage you to take your nausea medication as directed   If you develop nausea and vomiting that is not controlled by your nausea medication, call the clinic.   BELOW ARE SYMPTOMS THAT SHOULD BE REPORTED IMMEDIATELY:  *FEVER GREATER THAN 100.5 F  *CHILLS WITH OR WITHOUT FEVER  NAUSEA AND VOMITING THAT IS NOT CONTROLLED WITH YOUR NAUSEA MEDICATION  *UNUSUAL SHORTNESS OF BREATH  *UNUSUAL BRUISING OR BLEEDING  TENDERNESS IN MOUTH AND THROAT WITH OR WITHOUT PRESENCE OF ULCERS  *URINARY PROBLEMS  *BOWEL PROBLEMS  UNUSUAL RASH Items with * indicate a potential emergency and should be followed up as soon as possible.  Feel free to call the clinic should you have any questions or concerns. The clinic phone number is [phone].  Please show the CHEMO ALERT CARD at check-in to the Emergency Department and triage nurse.  Ipilimumab injection What is this medicine? IPILIMUMAB (IP i LIM ue mab) is a monoclonal antibody. It is used to treat colorectal cancer, kidney cancer, liver cancer, lung cancer, melanoma, and mesothelioma. This medicine may be used for other purposes; ask your health care provider or pharmacist if you have questions. COMMON BRAND NAME(S): YERVOY What should I tell my health care provider before I take this medicine? They need to know if you have any of these conditions:  autoimmune diseases like Crohn's disease, ulcerative colitis, or lupus  have had or planning to have an allogeneic stem cell transplant (uses someone else's stem cells)  history of organ transplant  nervous system problems like myasthenia gravis or Guillain-Barre syndrome  an unusual or allergic reaction to ipilimumab, other medicines, foods, dyes, or  preservatives  pregnant or trying to get pregnant  breast-feeding How should I use this medicine? This medicine is for infusion into a vein. It is given by a health care professional in a hospital or clinic setting. A special MedGuide will be given to you before each treatment. Be sure to read this information carefully each time. Talk to your pediatrician regarding the use of this medicine in children. While this drug may be prescribed for children as young as 12 years for selected conditions, precautions do apply. Overdosage: If you think you have taken too much of this medicine contact a poison control center or emergency room at once. NOTE: This medicine is only for you. Do not share this medicine with others. What if I miss a dose? It is important not to miss your dose. Call your doctor or health care professional if you are unable to keep an appointment. What may interact with this medicine? Interactions are not expected. This list may not describe all possible interactions. Give your health care provider a list of all the medicines, herbs, non-prescription drugs, or dietary supplements you use. Also tell them if you smoke, drink alcohol, or use illegal drugs. Some items may interact with your medicine. What should I watch for while using this medicine? Tell your doctor or healthcare professional if your symptoms do not start to get better or if they get worse. Do not become pregnant while taking this medicine or for 3 months after stopping it. Women should inform their doctor if they wish to become pregnant or think they might be pregnant. There is a potential for serious side effects to an unborn child. Talk to   your health care professional or pharmacist for more information. Do not breast-feed an infant while taking this medicine or for 3 months after the last dose. Your condition will be monitored carefully while you are receiving this medicine. You may need blood work done while you  are taking this medicine. What side effects may I notice from receiving this medicine? Side effects that you should report to your doctor or health care professional as soon as possible:  allergic reactions like skin rash, itching or hives, swelling of the face, lips, or tongue  black, tarry stools  bloody or watery diarrhea  changes in vision  dizziness  eye pain  fast, irregular heartbeat  feeling anxious  feeling faint or lightheaded, falls  nausea, vomiting  pain, tingling, numbness in the hands or feet  redness, blistering, peeling or loosening of the skin, including inside the mouth  signs and symptoms of liver injury like dark yellow or brown urine; general ill feeling or flu-like symptoms; light-colored stools; loss of appetite; nausea; right upper belly pain; unusually weak or tired; yellowing of the eyes or skin  unusual bleeding or bruising Side effects that usually do not require medical attention (report to your doctor or health care professional if they continue or are bothersome):  headache  loss of appetite  trouble sleeping This list may not describe all possible side effects. Call your doctor for medical advice about side effects. You may report side effects to FDA at 1-800-FDA-1088. Where should I keep my medicine? This drug is given in a hospital or clinic and will not be stored at home. NOTE: This sheet is a summary. It may not cover all possible information. If you have questions about this medicine, talk to your doctor, pharmacist, or health care provider.  2021 Elsevier/Gold Standard (2019-10-08 18:53:00) Nivolumab injection What is this medicine? NIVOLUMAB (nye VOL ue mab) is a monoclonal antibody. It treats certain types of cancer. Some of the cancers treated are colon cancer, head and neck cancer, Hodgkin lymphoma, lung cancer, and melanoma. This medicine may be used for other purposes; ask your health care provider or pharmacist if you have  questions. COMMON BRAND NAME(S): Opdivo What should I tell my health care provider before I take this medicine? They need to know if you have any of these conditions:  autoimmune diseases like Crohn's disease, ulcerative colitis, or lupus  have had or planning to have an allogeneic stem cell transplant (uses someone else's stem cells)  history of chest radiation  history of organ transplant  nervous system problems like myasthenia gravis or Guillain-Barre syndrome  an unusual or allergic reaction to nivolumab, other medicines, foods, dyes, or preservatives  pregnant or trying to get pregnant  breast-feeding How should I use this medicine? This medicine is for infusion into a vein. It is given by a health care professional in a hospital or clinic setting. A special MedGuide will be given to you before each treatment. Be sure to read this information carefully each time. Talk to your pediatrician regarding the use of this medicine in children. While this drug may be prescribed for children as young as 12 years for selected conditions, precautions do apply. Overdosage: If you think you have taken too much of this medicine contact a poison control center or emergency room at once. NOTE: This medicine is only for you. Do not share this medicine with others. What if I miss a dose? It is important not to miss your dose. Call your doctor or   health care professional if you are unable to keep an appointment. What may interact with this medicine? Interactions have not been studied. This list may not describe all possible interactions. Give your health care provider a list of all the medicines, herbs, non-prescription drugs, or dietary supplements you use. Also tell them if you smoke, drink alcohol, or use illegal drugs. Some items may interact with your medicine. What should I watch for while using this medicine? This drug may make you feel generally unwell. Continue your course of treatment  even though you feel ill unless your doctor tells you to stop. You may need blood work done while you are taking this medicine. Do not become pregnant while taking this medicine or for 5 months after stopping it. Women should inform their doctor if they wish to become pregnant or think they might be pregnant. There is a potential for serious side effects to an unborn child. Talk to your health care professional or pharmacist for more information. Do not breast-feed an infant while taking this medicine or for 5 months after stopping it. What side effects may I notice from receiving this medicine? Side effects that you should report to your doctor or health care professional as soon as possible:  allergic reactions like skin rash, itching or hives, swelling of the face, lips, or tongue  breathing problems  blood in the urine  bloody or watery diarrhea or black, tarry stools  changes in emotions or moods  changes in vision  chest pain  cough  dizziness  feeling faint or lightheaded, falls  fever, chills  headache with fever, neck stiffness, confusion, loss of memory, sensitivity to light, hallucination, loss of contact with reality, or seizures  joint pain  mouth sores  redness, blistering, peeling or loosening of the skin, including inside the mouth  severe muscle pain or weakness  signs and symptoms of high blood sugar such as dizziness; dry mouth; dry skin; fruity breath; nausea; stomach pain; increased hunger or thirst; increased urination  signs and symptoms of kidney injury like trouble passing urine or change in the amount of urine  signs and symptoms of liver injury like dark yellow or brown urine; general ill feeling or flu-like symptoms; light-colored stools; loss of appetite; nausea; right upper belly pain; unusually weak or tired; yellowing of the eyes or skin  swelling of the ankles, feet, hands  trouble passing urine or change in the amount of  urine  unusually weak or tired  weight gain or loss Side effects that usually do not require medical attention (report to your doctor or health care professional if they continue or are bothersome):  bone pain  constipation  decreased appetite  diarrhea  muscle pain  nausea, vomiting  tiredness This list may not describe all possible side effects. Call your doctor for medical advice about side effects. You may report side effects to FDA at 1-800-FDA-1088. Where should I keep my medicine? This drug is given in a hospital or clinic and will not be stored at home. NOTE: This sheet is a summary. It may not cover all possible information. If you have questions about this medicine, talk to your doctor, pharmacist, or health care provider.  2021 Elsevier/Gold Standard (2020-03-10 10:08:25)  

## 2021-01-18 ENCOUNTER — Encounter: Payer: Self-pay | Admitting: Oncology

## 2021-01-18 ENCOUNTER — Telehealth: Payer: Self-pay | Admitting: Oncology

## 2021-01-18 NOTE — Telephone Encounter (Signed)
Rescheduled appointments per charge's orders per scheduled messages, patient has been called and notified.

## 2021-01-18 NOTE — Progress Notes (Signed)
Patient emailed a copy of paystub for [employer].

## 2021-01-28 ENCOUNTER — Other Ambulatory Visit: Payer: Managed Care, Other (non HMO)

## 2021-01-28 ENCOUNTER — Ambulatory Visit: Payer: Managed Care, Other (non HMO) | Admitting: Oncology

## 2021-01-28 ENCOUNTER — Ambulatory Visit: Payer: Managed Care, Other (non HMO)

## 2021-02-04 ENCOUNTER — Inpatient Hospital Stay: Payer: Managed Care, Other (non HMO) | Admitting: Oncology

## 2021-02-04 ENCOUNTER — Other Ambulatory Visit: Payer: Self-pay

## 2021-02-04 ENCOUNTER — Inpatient Hospital Stay: Payer: Managed Care, Other (non HMO)

## 2021-02-04 ENCOUNTER — Inpatient Hospital Stay: Payer: Managed Care, Other (non HMO) | Attending: Oncology

## 2021-02-04 VITALS — BP 142/75 | HR 73 | Temp 96.7°F | Resp 18 | Ht 74.0 in | Wt 205.7 lb

## 2021-02-04 DIAGNOSIS — C439 Malignant melanoma of skin, unspecified: Secondary | ICD-10-CM

## 2021-02-04 DIAGNOSIS — Z79899 Other long term (current) drug therapy: Secondary | ICD-10-CM | POA: Insufficient documentation

## 2021-02-04 DIAGNOSIS — Z5112 Encounter for antineoplastic immunotherapy: Secondary | ICD-10-CM | POA: Diagnosis present

## 2021-02-04 LAB — CBC WITH DIFFERENTIAL (CANCER CENTER ONLY)
Abs Immature Granulocytes: 0.02 10*3/uL (ref 0.00–0.07)
Basophils Absolute: 0 10*3/uL (ref 0.0–0.1)
Basophils Relative: 1 %
Eosinophils Absolute: 1.1 10*3/uL — ABNORMAL HIGH (ref 0.0–0.5)
Eosinophils Relative: 19 %
HCT: 42.2 % (ref 39.0–52.0)
Hemoglobin: 14.3 g/dL (ref 13.0–17.0)
Immature Granulocytes: 0 %
Lymphocytes Relative: 22 %
Lymphs Abs: 1.3 10*3/uL (ref 0.7–4.0)
MCH: 28.4 pg (ref 26.0–34.0)
MCHC: 33.9 g/dL (ref 30.0–36.0)
MCV: 83.9 fL (ref 80.0–100.0)
Monocytes Absolute: 0.5 10*3/uL (ref 0.1–1.0)
Monocytes Relative: 8 %
Neutro Abs: 3 10*3/uL (ref 1.7–7.7)
Neutrophils Relative %: 50 %
Platelet Count: 200 10*3/uL (ref 150–400)
RBC: 5.03 MIL/uL (ref 4.22–5.81)
RDW: 12.9 % (ref 11.5–15.5)
WBC Count: 5.8 10*3/uL (ref 4.0–10.5)
nRBC: 0 % (ref 0.0–0.2)

## 2021-02-04 LAB — CMP (CANCER CENTER ONLY)
ALT: 14 U/L (ref 0–44)
AST: 8 U/L — ABNORMAL LOW (ref 15–41)
Albumin: 3.7 g/dL (ref 3.5–5.0)
Alkaline Phosphatase: 68 U/L (ref 38–126)
Anion gap: 9 (ref 5–15)
BUN: 10 mg/dL (ref 6–20)
CO2: 27 mmol/L (ref 22–32)
Calcium: 9.1 mg/dL (ref 8.9–10.3)
Chloride: 99 mmol/L (ref 98–111)
Creatinine: 0.81 mg/dL (ref 0.61–1.24)
GFR, Estimated: >60 mL/min (ref >60)
Glucose, Bld: 360 mg/dL — ABNORMAL HIGH (ref 70–99)
Potassium: 3.8 mmol/L (ref 3.5–5.1)
Sodium: 135 mmol/L (ref 135–145)
Total Bilirubin: 0.4 mg/dL (ref 0.3–1.2)
Total Protein: 6.8 g/dL (ref 6.5–8.1)

## 2021-02-04 LAB — TSH: TSH: 0.843 u[IU]/mL (ref 0.320–4.118)

## 2021-02-04 MED ORDER — DIPHENHYDRAMINE HCL 50 MG/ML IJ SOLN
25.0000 mg | Freq: Once | INTRAMUSCULAR | Status: AC
  Administered 2021-02-04: 25 mg via INTRAVENOUS

## 2021-02-04 MED ORDER — FAMOTIDINE IN NACL 20-0.9 MG/50ML-% IV SOLN
20.0000 mg | Freq: Once | INTRAVENOUS | Status: AC
  Administered 2021-02-04: 20 mg via INTRAVENOUS

## 2021-02-04 MED ORDER — SODIUM CHLORIDE 0.9 % IV SOLN
1.0000 mg/kg | Freq: Once | INTRAVENOUS | Status: AC
  Administered 2021-02-04: 100 mg via INTRAVENOUS
  Filled 2021-02-04: qty 20

## 2021-02-04 MED ORDER — FAMOTIDINE IN NACL 20-0.9 MG/50ML-% IV SOLN
INTRAVENOUS | Status: AC
  Filled 2021-02-04: qty 50

## 2021-02-04 MED ORDER — DIPHENHYDRAMINE HCL 50 MG/ML IJ SOLN
INTRAMUSCULAR | Status: AC
  Filled 2021-02-04: qty 1

## 2021-02-04 MED ORDER — SODIUM CHLORIDE 0.9 % IV SOLN
3.0900 mg/kg | Freq: Once | INTRAVENOUS | Status: AC
  Administered 2021-02-04: 280 mg via INTRAVENOUS
  Filled 2021-02-04: qty 24

## 2021-02-04 MED ORDER — SODIUM CHLORIDE 0.9 % IV SOLN
Freq: Once | INTRAVENOUS | Status: AC
  Filled 2021-02-04: qty 250

## 2021-02-04 NOTE — Progress Notes (Signed)
Hematology and Oncology Follow Up Visit  Ryan Chandler [id number] 02-22-1966 55 y.o. 02/04/2021 11:30 AM Ryan Chandler, [name], Ryan Reaper, MD   Principle Diagnosis: 55 year old man with stage III melanoma presented with a bulky left axillary lymph node in January 2022.  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   Prior Therapy: He is status post biopsy with ultrasound guidance completed in January 2022 confirmed the presence of melanoma.  Current therapy: Ipilimumab 1 mg/kg and nivolumab 3 mg/kg neoadjuvant treatment started on January 14, 2021.  He is here for cycle 2 of therapy.  Interim History: Ryan Chandler returns today for a follow-up visit.  Since the last visit, he completed the first cycle of therapy without any complaints.  He denies any nausea, vomiting or abdominal pain.  He did notice a slight decrease in his axillary lymph node.  He had denies any diarrhea or fatigue.  He denies shortness of breath or difficulty breathing.  He has reported occasional pruritus.     Medications: I have reviewed the patient's current medications.  Current Outpatient Medications  Medication Sig Dispense Refill  . aspirin 81 MG EC tablet     . Blood Glucose Monitoring Suppl (ONE TOUCH ULTRA 2) w/Device KIT     . empagliflozin (JARDIANCE) 25 MG TABS tablet Take 25 mg by mouth daily.    . ergocalciferol (VITAMIN D2) 1.25 MG (50000 UT) capsule Take 1 capsule by mouth once a week.    . insulin glargine, 1 Unit Dial, (TOUJEO SOLOSTAR) 300 UNIT/ML Solostar Pen     . lisinopril (PRINIVIL,ZESTRIL) 5 MG tablet Take 5 mg by mouth daily.    . metFORMIN (GLUCOPHAGE) 1000 MG tablet Take 1,000 mg by mouth 2 (two) times daily.    . ONE TOUCH CLUB LANCETS MISC     . prochlorperazine (COMPAZINE) 10 MG tablet Take 1 tablet (10 mg total) by mouth every 6 (six) hours as needed for nausea or vomiting. 30 tablet 0  . simvastatin (ZOCOR) 40 MG tablet Take 40 mg by mouth daily.     No current  facility-administered medications for this visit.     Allergies: No Known Allergies    Physical Exam: Blood pressure (!) 142/75, pulse 73, temperature (!) 96.7 F (35.9 C), temperature source Tympanic, resp. rate 18, height 6' 2"  (1.88 m), weight 205 lb 11.2 oz (93.3 kg), SpO2 99 %.   ECOG: 0   General appearance: Comfortable appearing without any discomfort Head: Normocephalic without any trauma Oropharynx: Mucous membranes are moist and pink without any thrush or ulcers. Eyes: Pupils are equal and round reactive to light. Lymph nodes:  Slight reduction in his left axillary lymph node. Heart:regular rate and rhythm.  S1 and S2 without leg edema. Lung: Clear without any rhonchi or wheezes.  No dullness to percussion. Abdomin: Soft, nontender, nondistended with good bowel sounds.  No hepatosplenomegaly. Musculoskeletal: No joint deformity or effusion.  Full range of motion noted. Neurological: No deficits noted on motor, sensory and deep tendon reflex exam. Skin: No petechial rash or dryness.  Appeared moist.      Lab Results: Lab Results  Component Value Date   WBC 5.8 02/04/2021   HGB 14.3 02/04/2021   HCT 42.2 02/04/2021   MCV 83.9 02/04/2021   PLT 200 02/04/2021     Chemistry      Component Value Date/Time   NA 137 01/14/2021 0909   K 3.9 01/14/2021 0909   CL 101 01/14/2021 0909   CO2 26 01/14/2021 0909  BUN 7 01/14/2021 0909   CREATININE 0.78 01/14/2021 0909      Component Value Date/Time   CALCIUM 9.3 01/14/2021 0909   ALKPHOS 70 01/14/2021 0909   AST 9 (L) 01/14/2021 0909   ALT 11 01/14/2021 0909   BILITOT 0.4 01/14/2021 0909        Impression and Plan:    55 year old man with:  1.  Stage III melanoma presented with a bulky left axillary lymphadenopathy of unknown primary diagnosed in January 2022.  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  He is currently receiving ipilimumab and nivolumab without any major complications after cycle 1 of  therapy.  Risks and benefits of proceeding with cycle 2 were reviewed.  Alternative therapy including BRAF based treatment were discussed.  At this time, I recommended proceeding with cycle 2 given the potential positive response he is experiencing at this time.  If he does not experience robust response, BRAF targeted therapy would be used if he harbors the appropriate mutation.  The plan is to complete 4 cycles of therapy.  2.  IV access: No issues with peripheral veins at this time .  Port-A-Cath will be deferred.   3.  Immune mediated complications: I continue to educate him about potential complication occluding pneumonitis, colitis and thyroid disease.  4.  Antiemetics: No nausea or vomiting reported at this time.  Compazine is available to him.  5.  Follow-up: In 3 weeks for the next cycle of therapy.   30  minutes were spent on this encounter.  Time was dedicated to reviewing treatment options, complications related to his cancer and cancer therapy and answering questions regarding future plan of care.   Zola Button, MD 3/18/202211:30 AM

## 2021-02-04 NOTE — Patient Instructions (Signed)
[facility] Discharge Instructions for Patients Receiving Chemotherapy  Today you received the following chemotherapy agents Opdivo and Yerovy   To help prevent nausea and vomiting after your treatment, we encourage you to take your nausea medication as directed   If you develop nausea and vomiting that is not controlled by your nausea medication, call the clinic.   BELOW ARE SYMPTOMS THAT SHOULD BE REPORTED IMMEDIATELY:  *FEVER GREATER THAN 100.5 F  *CHILLS WITH OR WITHOUT FEVER  NAUSEA AND VOMITING THAT IS NOT CONTROLLED WITH YOUR NAUSEA MEDICATION  *UNUSUAL SHORTNESS OF BREATH  *UNUSUAL BRUISING OR BLEEDING  TENDERNESS IN MOUTH AND THROAT WITH OR WITHOUT PRESENCE OF ULCERS  *URINARY PROBLEMS  *BOWEL PROBLEMS  UNUSUAL RASH Items with * indicate a potential emergency and should be followed up as soon as possible.  Feel free to call the clinic should you have any questions or concerns. The clinic phone number is [phone].  Please show the CHEMO ALERT CARD at check-in to the Emergency Department and triage nurse.  Ipilimumab injection What is this medicine? IPILIMUMAB (IP i LIM ue mab) is a monoclonal antibody. It is used to treat colorectal cancer, kidney cancer, liver cancer, lung cancer, melanoma, and mesothelioma. This medicine may be used for other purposes; ask your health care provider or pharmacist if you have questions. COMMON BRAND NAME(S): YERVOY What should I tell my health care provider before I take this medicine? They need to know if you have any of these conditions:  autoimmune diseases like Crohn's disease, ulcerative colitis, or lupus  have had or planning to have an allogeneic stem cell transplant (uses someone else's stem cells)  history of organ transplant  nervous system problems like myasthenia gravis or Guillain-Barre syndrome  an unusual or allergic reaction to ipilimumab, other medicines, foods, dyes, or  preservatives  pregnant or trying to get pregnant  breast-feeding How should I use this medicine? This medicine is for infusion into a vein. It is given by a health care professional in a hospital or clinic setting. A special MedGuide will be given to you before each treatment. Be sure to read this information carefully each time. Talk to your pediatrician regarding the use of this medicine in children. While this drug may be prescribed for children as young as 12 years for selected conditions, precautions do apply. Overdosage: If you think you have taken too much of this medicine contact a poison control center or emergency room at once. NOTE: This medicine is only for you. Do not share this medicine with others. What if I miss a dose? It is important not to miss your dose. Call your doctor or health care professional if you are unable to keep an appointment. What may interact with this medicine? Interactions are not expected. This list may not describe all possible interactions. Give your health care provider a list of all the medicines, herbs, non-prescription drugs, or dietary supplements you use. Also tell them if you smoke, drink alcohol, or use illegal drugs. Some items may interact with your medicine. What should I watch for while using this medicine? Tell your doctor or healthcare professional if your symptoms do not start to get better or if they get worse. Do not become pregnant while taking this medicine or for 3 months after stopping it. Women should inform their doctor if they wish to become pregnant or think they might be pregnant. There is a potential for serious side effects to an unborn child. Talk to   your health care professional or pharmacist for more information. Do not breast-feed an infant while taking this medicine or for 3 months after the last dose. Your condition will be monitored carefully while you are receiving this medicine. You may need blood work done while you  are taking this medicine. What side effects may I notice from receiving this medicine? Side effects that you should report to your doctor or health care professional as soon as possible:  allergic reactions like skin rash, itching or hives, swelling of the face, lips, or tongue  black, tarry stools  bloody or watery diarrhea  changes in vision  dizziness  eye pain  fast, irregular heartbeat  feeling anxious  feeling faint or lightheaded, falls  nausea, vomiting  pain, tingling, numbness in the hands or feet  redness, blistering, peeling or loosening of the skin, including inside the mouth  signs and symptoms of liver injury like dark yellow or brown urine; general ill feeling or flu-like symptoms; light-colored stools; loss of appetite; nausea; right upper belly pain; unusually weak or tired; yellowing of the eyes or skin  unusual bleeding or bruising Side effects that usually do not require medical attention (report to your doctor or health care professional if they continue or are bothersome):  headache  loss of appetite  trouble sleeping This list may not describe all possible side effects. Call your doctor for medical advice about side effects. You may report side effects to FDA at 1-800-FDA-1088. Where should I keep my medicine? This drug is given in a hospital or clinic and will not be stored at home. NOTE: This sheet is a summary. It may not cover all possible information. If you have questions about this medicine, talk to your doctor, pharmacist, or health care provider.  2021 Elsevier/Gold Standard (2019-10-08 18:53:00) Nivolumab injection What is this medicine? NIVOLUMAB (nye VOL ue mab) is a monoclonal antibody. It treats certain types of cancer. Some of the cancers treated are colon cancer, head and neck cancer, Hodgkin lymphoma, lung cancer, and melanoma. This medicine may be used for other purposes; ask your health care provider or pharmacist if you have  questions. COMMON BRAND NAME(S): Opdivo What should I tell my health care provider before I take this medicine? They need to know if you have any of these conditions:  autoimmune diseases like Crohn's disease, ulcerative colitis, or lupus  have had or planning to have an allogeneic stem cell transplant (uses someone else's stem cells)  history of chest radiation  history of organ transplant  nervous system problems like myasthenia gravis or Guillain-Barre syndrome  an unusual or allergic reaction to nivolumab, other medicines, foods, dyes, or preservatives  pregnant or trying to get pregnant  breast-feeding How should I use this medicine? This medicine is for infusion into a vein. It is given by a health care professional in a hospital or clinic setting. A special MedGuide will be given to you before each treatment. Be sure to read this information carefully each time. Talk to your pediatrician regarding the use of this medicine in children. While this drug may be prescribed for children as young as 12 years for selected conditions, precautions do apply. Overdosage: If you think you have taken too much of this medicine contact a poison control center or emergency room at once. NOTE: This medicine is only for you. Do not share this medicine with others. What if I miss a dose? It is important not to miss your dose. Call your doctor or   health care professional if you are unable to keep an appointment. What may interact with this medicine? Interactions have not been studied. This list may not describe all possible interactions. Give your health care provider a list of all the medicines, herbs, non-prescription drugs, or dietary supplements you use. Also tell them if you smoke, drink alcohol, or use illegal drugs. Some items may interact with your medicine. What should I watch for while using this medicine? This drug may make you feel generally unwell. Continue your course of treatment  even though you feel ill unless your doctor tells you to stop. You may need blood work done while you are taking this medicine. Do not become pregnant while taking this medicine or for 5 months after stopping it. Women should inform their doctor if they wish to become pregnant or think they might be pregnant. There is a potential for serious side effects to an unborn child. Talk to your health care professional or pharmacist for more information. Do not breast-feed an infant while taking this medicine or for 5 months after stopping it. What side effects may I notice from receiving this medicine? Side effects that you should report to your doctor or health care professional as soon as possible:  allergic reactions like skin rash, itching or hives, swelling of the face, lips, or tongue  breathing problems  blood in the urine  bloody or watery diarrhea or black, tarry stools  changes in emotions or moods  changes in vision  chest pain  cough  dizziness  feeling faint or lightheaded, falls  fever, chills  headache with fever, neck stiffness, confusion, loss of memory, sensitivity to light, hallucination, loss of contact with reality, or seizures  joint pain  mouth sores  redness, blistering, peeling or loosening of the skin, including inside the mouth  severe muscle pain or weakness  signs and symptoms of high blood sugar such as dizziness; dry mouth; dry skin; fruity breath; nausea; stomach pain; increased hunger or thirst; increased urination  signs and symptoms of kidney injury like trouble passing urine or change in the amount of urine  signs and symptoms of liver injury like dark yellow or brown urine; general ill feeling or flu-like symptoms; light-colored stools; loss of appetite; nausea; right upper belly pain; unusually weak or tired; yellowing of the eyes or skin  swelling of the ankles, feet, hands  trouble passing urine or change in the amount of  urine  unusually weak or tired  weight gain or loss Side effects that usually do not require medical attention (report to your doctor or health care professional if they continue or are bothersome):  bone pain  constipation  decreased appetite  diarrhea  muscle pain  nausea, vomiting  tiredness This list may not describe all possible side effects. Call your doctor for medical advice about side effects. You may report side effects to FDA at 1-800-FDA-1088. Where should I keep my medicine? This drug is given in a hospital or clinic and will not be stored at home. NOTE: This sheet is a summary. It may not cover all possible information. If you have questions about this medicine, talk to your doctor, pharmacist, or health care provider.  2021 [employer]vier/Gold Standard (2020-03-10 10:08:25)  

## 2021-02-18 ENCOUNTER — Other Ambulatory Visit: Payer: Managed Care, Other (non HMO)

## 2021-02-18 ENCOUNTER — Ambulatory Visit: Payer: Managed Care, Other (non HMO)

## 2021-02-18 ENCOUNTER — Ambulatory Visit: Payer: Managed Care, Other (non HMO) | Admitting: Oncology

## 2021-02-24 ENCOUNTER — Inpatient Hospital Stay: Payer: Managed Care, Other (non HMO) | Attending: Oncology

## 2021-02-24 ENCOUNTER — Inpatient Hospital Stay: Payer: Managed Care, Other (non HMO)

## 2021-02-24 ENCOUNTER — Other Ambulatory Visit: Payer: Self-pay

## 2021-02-24 VITALS — BP 144/82 | HR 80 | Temp 98.1°F | Resp 18 | Wt 203.2 lb

## 2021-02-24 DIAGNOSIS — C439 Malignant melanoma of skin, unspecified: Secondary | ICD-10-CM | POA: Diagnosis present

## 2021-02-24 DIAGNOSIS — Z5112 Encounter for antineoplastic immunotherapy: Secondary | ICD-10-CM | POA: Insufficient documentation

## 2021-02-24 DIAGNOSIS — Z79899 Other long term (current) drug therapy: Secondary | ICD-10-CM | POA: Diagnosis not present

## 2021-02-24 LAB — CBC WITH DIFFERENTIAL (CANCER CENTER ONLY)
Abs Immature Granulocytes: 0.03 10*3/uL (ref 0.00–0.07)
Basophils Absolute: 0 10*3/uL (ref 0.0–0.1)
Basophils Relative: 1 %
Eosinophils Absolute: 0.5 10*3/uL (ref 0.0–0.5)
Eosinophils Relative: 10 %
HCT: 44.1 % (ref 39.0–52.0)
Hemoglobin: 14.6 g/dL (ref 13.0–17.0)
Immature Granulocytes: 1 %
Lymphocytes Relative: 26 %
Lymphs Abs: 1.3 10*3/uL (ref 0.7–4.0)
MCH: 28.1 pg (ref 26.0–34.0)
MCHC: 33.1 g/dL (ref 30.0–36.0)
MCV: 85 fL (ref 80.0–100.0)
Monocytes Absolute: 0.5 10*3/uL (ref 0.1–1.0)
Monocytes Relative: 10 %
Neutro Abs: 2.8 10*3/uL (ref 1.7–7.7)
Neutrophils Relative %: 52 %
Platelet Count: 211 10*3/uL (ref 150–400)
RBC: 5.19 MIL/uL (ref 4.22–5.81)
RDW: 12.6 % (ref 11.5–15.5)
WBC Count: 5.3 10*3/uL (ref 4.0–10.5)
nRBC: 0 % (ref 0.0–0.2)

## 2021-02-24 LAB — CMP (CANCER CENTER ONLY)
ALT: 12 U/L (ref 0–44)
AST: 9 U/L — ABNORMAL LOW (ref 15–41)
Albumin: 3.9 g/dL (ref 3.5–5.0)
Alkaline Phosphatase: 83 U/L (ref 38–126)
Anion gap: 13 (ref 5–15)
BUN: 8 mg/dL (ref 6–20)
CO2: 24 mmol/L (ref 22–32)
Calcium: 8.8 mg/dL — ABNORMAL LOW (ref 8.9–10.3)
Chloride: 97 mmol/L — ABNORMAL LOW (ref 98–111)
Creatinine: 0.93 mg/dL (ref 0.61–1.24)
GFR, Estimated: >60 mL/min (ref >60)
Glucose, Bld: 405 mg/dL — ABNORMAL HIGH (ref 70–99)
Potassium: 4 mmol/L (ref 3.5–5.1)
Sodium: 134 mmol/L — ABNORMAL LOW (ref 135–145)
Total Bilirubin: 0.5 mg/dL (ref 0.3–1.2)
Total Protein: 7.3 g/dL (ref 6.5–8.1)

## 2021-02-24 LAB — TSH: TSH: 0.883 u[IU]/mL (ref 0.320–4.118)

## 2021-02-24 MED ORDER — FAMOTIDINE IN NACL 20-0.9 MG/50ML-% IV SOLN
INTRAVENOUS | Status: AC
  Filled 2021-02-24: qty 50

## 2021-02-24 MED ORDER — SODIUM CHLORIDE 0.9 % IV SOLN
Freq: Once | INTRAVENOUS | Status: AC
  Filled 2021-02-24: qty 250

## 2021-02-24 MED ORDER — SODIUM CHLORIDE 0.9 % IV SOLN
1.0000 mg/kg | Freq: Once | INTRAVENOUS | Status: AC
  Administered 2021-02-24: 100 mg via INTRAVENOUS
  Filled 2021-02-24: qty 20

## 2021-02-24 MED ORDER — FAMOTIDINE IN NACL 20-0.9 MG/50ML-% IV SOLN
20.0000 mg | Freq: Once | INTRAVENOUS | Status: AC
  Administered 2021-02-24: 20 mg via INTRAVENOUS

## 2021-02-24 MED ORDER — DIPHENHYDRAMINE HCL 50 MG/ML IJ SOLN
INTRAMUSCULAR | Status: AC
  Filled 2021-02-24: qty 1

## 2021-02-24 MED ORDER — DIPHENHYDRAMINE HCL 50 MG/ML IJ SOLN
25.0000 mg | Freq: Once | INTRAMUSCULAR | Status: AC
  Administered 2021-02-24: 25 mg via INTRAVENOUS

## 2021-02-24 MED ORDER — SODIUM CHLORIDE 0.9 % IV SOLN
3.0900 mg/kg | Freq: Once | INTRAVENOUS | Status: AC
  Administered 2021-02-24: 280 mg via INTRAVENOUS
  Filled 2021-02-24: qty 4

## 2021-02-24 NOTE — Patient Instructions (Signed)
[facility] Discharge Instructions for Patients Receiving Chemotherapy  Today you received the following chemotherapy agents Opdivo and Katheran James   To help prevent nausea and vomiting after your treatment, we encourage you to take your nausea medication as directed   If you develop nausea and vomiting that is not controlled by your nausea medication, call the clinic.   BELOW ARE SYMPTOMS THAT SHOULD BE REPORTED IMMEDIATELY:  *FEVER GREATER THAN 100.5 F  *CHILLS WITH OR WITHOUT FEVER  NAUSEA AND VOMITING THAT IS NOT CONTROLLED WITH YOUR NAUSEA MEDICATION  *UNUSUAL SHORTNESS OF BREATH  *UNUSUAL BRUISING OR BLEEDING  TENDERNESS IN MOUTH AND THROAT WITH OR WITHOUT PRESENCE OF ULCERS  *URINARY PROBLEMS  *BOWEL PROBLEMS  UNUSUAL RASH Items with * indicate a potential emergency and should be followed up as soon as possible.  Feel free to call the clinic should you have any questions or concerns. The clinic phone number is [phone].  Please show the [facility] at check-in to the Emergency Department and triage nurse.  Ipilimumab injection What is this medicine? IPILIMUMAB (IP i LIM ue mab) is a monoclonal antibody. It is used to treat colorectal cancer, kidney cancer, liver cancer, lung cancer, melanoma, and mesothelioma. This medicine may be used for other purposes; ask your health care provider or pharmacist if you have questions. COMMON BRAND NAME(S): YERVOY What should I tell my health care provider before I take this medicine? They need to know if you have any of these conditions:  autoimmune diseases like Crohn's disease, ulcerative colitis, or lupus  have had or planning to have an allogeneic stem cell transplant (uses someone else's stem cells)  history of organ transplant  nervous system problems like myasthenia gravis or Guillain-Barre syndrome  an unusual or allergic reaction to ipilimumab, other medicines, foods, dyes, or  preservatives  pregnant or trying to get pregnant  breast-feeding How should I use this medicine? This medicine is for infusion into a vein. It is given by a health care professional in a hospital or clinic setting. A special MedGuide will be given to you before each treatment. Be sure to read this information carefully each time. Talk to your pediatrician regarding the use of this medicine in children. While this drug may be prescribed for children as young as 12 years for selected conditions, precautions do apply. Overdosage: If you think you have taken too much of this medicine contact a poison control center or emergency room at once. NOTE: This medicine is only for you. Do not share this medicine with others. What if I miss a dose? It is important not to miss your dose. Call your doctor or health care professional if you are unable to keep an appointment. What may interact with this medicine? Interactions are not expected. This list may not describe all possible interactions. Give your health care provider a list of all the medicines, herbs, non-prescription drugs, or dietary supplements you use. Also tell them if you smoke, drink alcohol, or use illegal drugs. Some items may interact with your medicine. What should I watch for while using this medicine? Tell your doctor or healthcare professional if your symptoms do not start to get better or if they get worse. Do not become pregnant while taking this medicine or for 3 months after stopping it. Women should inform their doctor if they wish to become pregnant or think they might be pregnant. There is a potential for serious side effects to an unborn child. Talk to  your health care professional or pharmacist for more information. Do not breast-feed an infant while taking this medicine or for 3 months after the last dose. Your condition will be monitored carefully while you are receiving this medicine. You may need blood work done while you  are taking this medicine. What side effects may I notice from receiving this medicine? Side effects that you should report to your doctor or health care professional as soon as possible:  allergic reactions like skin rash, itching or hives, swelling of the face, lips, or tongue  black, tarry stools  bloody or watery diarrhea  changes in vision  dizziness  eye pain  fast, irregular heartbeat  feeling anxious  feeling faint or lightheaded, falls  nausea, vomiting  pain, tingling, numbness in the hands or feet  redness, blistering, peeling or loosening of the skin, including inside the mouth  signs and symptoms of liver injury like dark yellow or brown urine; general ill feeling or flu-like symptoms; light-colored stools; loss of appetite; nausea; right upper belly pain; unusually weak or tired; yellowing of the eyes or skin  unusual bleeding or bruising Side effects that usually do not require medical attention (report to your doctor or health care professional if they continue or are bothersome):  headache  loss of appetite  trouble sleeping This list may not describe all possible side effects. Call your doctor for medical advice about side effects. You may report side effects to FDA at 1-800-FDA-1088. Where should I keep my medicine? This drug is given in a hospital or clinic and will not be stored at home. NOTE: This sheet is a summary. It may not cover all possible information. If you have questions about this medicine, talk to your doctor, pharmacist, or health care provider.  2021 Elsevier/Gold Standard (2019-10-08 18:53:00) Nivolumab injection What is this medicine? NIVOLUMAB (nye VOL ue mab) is a monoclonal antibody. It treats certain types of cancer. Some of the cancers treated are colon cancer, head and neck cancer, Hodgkin lymphoma, lung cancer, and melanoma. This medicine may be used for other purposes; ask your health care provider or pharmacist if you have  questions. COMMON BRAND NAME(S): Opdivo What should I tell my health care provider before I take this medicine? They need to know if you have any of these conditions:  autoimmune diseases like Crohn's disease, ulcerative colitis, or lupus  have had or planning to have an allogeneic stem cell transplant (uses someone else's stem cells)  history of chest radiation  history of organ transplant  nervous system problems like myasthenia gravis or Guillain-Barre syndrome  an unusual or allergic reaction to nivolumab, other medicines, foods, dyes, or preservatives  pregnant or trying to get pregnant  breast-feeding How should I use this medicine? This medicine is for infusion into a vein. It is given by a health care professional in a hospital or clinic setting. A special MedGuide will be given to you before each treatment. Be sure to read this information carefully each time. Talk to your pediatrician regarding the use of this medicine in children. While this drug may be prescribed for children as young as 12 years for selected conditions, precautions do apply. Overdosage: If you think you have taken too much of this medicine contact a poison control center or emergency room at once. NOTE: This medicine is only for you. Do not share this medicine with others. What if I miss a dose? It is important not to miss your dose. Call your doctor or  health care professional if you are unable to keep an appointment. What may interact with this medicine? Interactions have not been studied. This list may not describe all possible interactions. Give your health care provider a list of all the medicines, herbs, non-prescription drugs, or dietary supplements you use. Also tell them if you smoke, drink alcohol, or use illegal drugs. Some items may interact with your medicine. What should I watch for while using this medicine? This drug may make you feel generally unwell. Continue your course of treatment  even though you feel ill unless your doctor tells you to stop. You may need blood work done while you are taking this medicine. Do not become pregnant while taking this medicine or for 5 months after stopping it. Women should inform their doctor if they wish to become pregnant or think they might be pregnant. There is a potential for serious side effects to an unborn child. Talk to your health care professional or pharmacist for more information. Do not breast-feed an infant while taking this medicine or for 5 months after stopping it. What side effects may I notice from receiving this medicine? Side effects that you should report to your doctor or health care professional as soon as possible:  allergic reactions like skin rash, itching or hives, swelling of the face, lips, or tongue  breathing problems  blood in the urine  bloody or watery diarrhea or black, tarry stools  changes in emotions or moods  changes in vision  chest pain  cough  dizziness  feeling faint or lightheaded, falls  fever, chills  headache with fever, neck stiffness, confusion, loss of memory, sensitivity to light, hallucination, loss of contact with reality, or seizures  joint pain  mouth sores  redness, blistering, peeling or loosening of the skin, including inside the mouth  severe muscle pain or weakness  signs and symptoms of high blood sugar such as dizziness; dry mouth; dry skin; fruity breath; nausea; stomach pain; increased hunger or thirst; increased urination  signs and symptoms of kidney injury like trouble passing urine or change in the amount of urine  signs and symptoms of liver injury like dark yellow or brown urine; general ill feeling or flu-like symptoms; light-colored stools; loss of appetite; nausea; right upper belly pain; unusually weak or tired; yellowing of the eyes or skin  swelling of the ankles, feet, hands  trouble passing urine or change in the amount of  urine  unusually weak or tired  weight gain or loss Side effects that usually do not require medical attention (report to your doctor or health care professional if they continue or are bothersome):  bone pain  constipation  decreased appetite  diarrhea  muscle pain  nausea, vomiting  tiredness This list may not describe all possible side effects. Call your doctor for medical advice about side effects. You may report side effects to FDA at 1-800-FDA-1088. Where should I keep my medicine? This drug is given in a hospital or clinic and will not be stored at home. NOTE: This sheet is a summary. It may not cover all possible information. If you have questions about this medicine, talk to your doctor, pharmacist, or health care provider.  2021 Elsevier/Gold Standard (2020-03-10 10:08:25)

## 2021-02-24 NOTE — Progress Notes (Signed)
Ipilimumab (YERVOY) Patient Monitoring Assessment   Is the patient experiencing any of the following general symptoms?:  [x] Difficulty performing normal activities [] Feeling sluggish or cold all the time [] Unusual weight gain [] Constant or unusual headaches [] Feeling dizzy or faint [] Changes in eyesight (blurry vision, double vision, or other vision problems) [] Changes in mood or behavior (ex: decreased sex drive, irritability, or forgetfulness) [] Starting new medications (ex: steroids, other medications that lower immune response) [] Patient is not experiencing any of the general symptoms above.    Gastrointestinal  Patient is having 1 bowel movements each day.  Is this different from baseline? [] Yes [x] No Are your stools watery or do they have a foul smell? [] Yes [x] No Have you seen blood in your stools? [] Yes [x] No Are your stools dark, tarry, or sticky? [] Yes [x] No Are you having pain or tenderness in your belly? [] Yes [x] No  Skin Does your skin itch? [x] Yes [] No Do you have a rash? [] Yes [x] No Has your skin blistered and/or peeled? [] Yes [x] No Do you have sores in your mouth? [] Yes [x] No  Hepatic Has your urine been dark or tea colored? [] Yes [x] No Have you noticed that your skin or the whites of your eyes are turning yellow? [] Yes [x] No Are you bleeding or bruising more easily than normal? [] Yes [x] No Are you nauseous and/or vomiting? [] Yes [x] No Do you have pain on the right side of your stomach? [] Yes [x] No  Neurologic  Are you having unusual weakness of legs, arms, or face? [x] Yes [] No Are you having numbness or tingling in your hands or feet? [] Yes [x] No   MD notified and made aware of patient symptoms. Okay to proceed with treatment.  Manuella Ghazi

## 2021-02-24 NOTE — Progress Notes (Signed)
Glucose = 405. Dr. [name] aware.  Proceed w/ tx as planned.  Kennith Center, Pharm.D., CPP 02/24/2021@11 :55 AM

## 2021-02-25 ENCOUNTER — Other Ambulatory Visit: Payer: Managed Care, Other (non HMO)

## 2021-02-25 ENCOUNTER — Ambulatory Visit: Payer: Managed Care, Other (non HMO) | Admitting: Oncology

## 2021-02-25 ENCOUNTER — Ambulatory Visit: Payer: Managed Care, Other (non HMO)

## 2021-03-18 ENCOUNTER — Inpatient Hospital Stay: Payer: Managed Care, Other (non HMO)

## 2021-03-18 ENCOUNTER — Inpatient Hospital Stay (HOSPITAL_BASED_OUTPATIENT_CLINIC_OR_DEPARTMENT_OTHER): Payer: Managed Care, Other (non HMO) | Admitting: Oncology

## 2021-03-18 ENCOUNTER — Other Ambulatory Visit: Payer: Self-pay

## 2021-03-18 VITALS — BP 132/75 | HR 72 | Temp 97.7°F | Resp 18 | Ht 74.0 in | Wt 204.8 lb

## 2021-03-18 DIAGNOSIS — C439 Malignant melanoma of skin, unspecified: Secondary | ICD-10-CM

## 2021-03-18 DIAGNOSIS — Z5112 Encounter for antineoplastic immunotherapy: Secondary | ICD-10-CM | POA: Diagnosis not present

## 2021-03-18 LAB — CMP (CANCER CENTER ONLY)
ALT: 15 U/L (ref 0–44)
AST: 14 U/L — ABNORMAL LOW (ref 15–41)
Albumin: 4 g/dL (ref 3.5–5.0)
Alkaline Phosphatase: 76 U/L (ref 38–126)
Anion gap: 12 (ref 5–15)
BUN: 11 mg/dL (ref 6–20)
CO2: 26 mmol/L (ref 22–32)
Calcium: 9.3 mg/dL (ref 8.9–10.3)
Chloride: 97 mmol/L — ABNORMAL LOW (ref 98–111)
Creatinine: 0.81 mg/dL (ref 0.61–1.24)
GFR, Estimated: >60 mL/min (ref >60)
Glucose, Bld: 203 mg/dL — ABNORMAL HIGH (ref 70–99)
Potassium: 4 mmol/L (ref 3.5–5.1)
Sodium: 135 mmol/L (ref 135–145)
Total Bilirubin: 0.6 mg/dL (ref 0.3–1.2)
Total Protein: 7.5 g/dL (ref 6.5–8.1)

## 2021-03-18 LAB — CBC WITH DIFFERENTIAL (CANCER CENTER ONLY)
Abs Immature Granulocytes: 0.04 10*3/uL (ref 0.00–0.07)
Basophils Absolute: 0.1 10*3/uL (ref 0.0–0.1)
Basophils Relative: 1 %
Eosinophils Absolute: 0.7 10*3/uL — ABNORMAL HIGH (ref 0.0–0.5)
Eosinophils Relative: 10 %
HCT: 43.7 % (ref 39.0–52.0)
Hemoglobin: 14.5 g/dL (ref 13.0–17.0)
Immature Granulocytes: 1 %
Lymphocytes Relative: 26 %
Lymphs Abs: 1.9 10*3/uL (ref 0.7–4.0)
MCH: 27.9 pg (ref 26.0–34.0)
MCHC: 33.2 g/dL (ref 30.0–36.0)
MCV: 84.2 fL (ref 80.0–100.0)
Monocytes Absolute: 0.7 10*3/uL (ref 0.1–1.0)
Monocytes Relative: 10 %
Neutro Abs: 3.9 10*3/uL (ref 1.7–7.7)
Neutrophils Relative %: 52 %
Platelet Count: 220 10*3/uL (ref 150–400)
RBC: 5.19 MIL/uL (ref 4.22–5.81)
RDW: 12.7 % (ref 11.5–15.5)
WBC Count: 7.3 10*3/uL (ref 4.0–10.5)
nRBC: 0 % (ref 0.0–0.2)

## 2021-03-18 LAB — TSH: TSH: 0.662 u[IU]/mL (ref 0.320–4.118)

## 2021-03-18 MED ORDER — SODIUM CHLORIDE 0.9 % IV SOLN
1.0000 mg/kg | Freq: Once | INTRAVENOUS | Status: AC
  Administered 2021-03-18: 100 mg via INTRAVENOUS
  Filled 2021-03-18: qty 20

## 2021-03-18 MED ORDER — FAMOTIDINE IN NACL 20-0.9 MG/50ML-% IV SOLN
20.0000 mg | Freq: Once | INTRAVENOUS | Status: AC
Start: 2021-03-18 — End: 2021-03-18
  Administered 2021-03-18: 20 mg via INTRAVENOUS

## 2021-03-18 MED ORDER — SODIUM CHLORIDE 0.9 % IV SOLN
Freq: Once | INTRAVENOUS | Status: AC
  Filled 2021-03-18: qty 250

## 2021-03-18 MED ORDER — DIPHENHYDRAMINE HCL 50 MG/ML IJ SOLN
INTRAMUSCULAR | Status: AC
  Filled 2021-03-18: qty 1

## 2021-03-18 MED ORDER — DIPHENHYDRAMINE HCL 50 MG/ML IJ SOLN
25.0000 mg | Freq: Once | INTRAMUSCULAR | Status: AC
  Administered 2021-03-18: 25 mg via INTRAVENOUS

## 2021-03-18 MED ORDER — FAMOTIDINE IN NACL 20-0.9 MG/50ML-% IV SOLN
INTRAVENOUS | Status: AC
  Filled 2021-03-18: qty 50

## 2021-03-18 MED ORDER — SODIUM CHLORIDE 0.9 % IV SOLN
3.0900 mg/kg | Freq: Once | INTRAVENOUS | Status: AC
  Administered 2021-03-18: 280 mg via INTRAVENOUS
  Filled 2021-03-18: qty 24

## 2021-03-18 NOTE — Patient Instructions (Signed)
[facility]ICAL [facility]COL[facility]   Discharge Instructions: Thank you for choosing [facility] to provide your oncology and hematology care.   If you have a lab appointment with the Cancer Center, please go directly to the Cancer Center and check in at the registration area.   Wear comfortable clothing and clothing appropriate for easy access to any Portacath or PICC line.   We strive to give you quality time with your provider. You may need to reschedule your appointment if you arrive late (15 or more minutes).  Arriving late affects you and other patients whose appointments are after yours.  Also, if you miss three or more appointments without notifying the office, you may be dismissed from the clinic at the provider's discretion.      For prescription refill requests, have your pharmacy contact our office and allow 72 hours for refills to be completed.    Today you received the following chemotherapy and/or immunotherapy agents: Nivolumab (Opdivo) and Ipilimumab (Yervoy).      To help prevent nausea and vomiting after your treatment, we encourage you to take your nausea medication as directed.  BELOW ARE SYMPTOMS THAT SHOULD BE REPORTED IMMEDIATELY: *FEVER GREATER THAN 100.4 F (38 C) OR HIGHER *CHILLS OR SWEATING *NAUSEA AND VOMITING THAT IS NOT CONTROLLED WITH YOUR NAUSEA MEDICATION *UNUSUAL SHORTNESS OF BREATH *UNUSUAL BRUISING OR BLEEDING *URINARY PROBLEMS (pain or burning when urinating, or frequent urination) *BOWEL PROBLEMS (unusual diarrhea, constipation, pain near the anus) TENDERNESS IN MOUTH AND THROAT WITH OR WITHOUT PRESENCE OF ULCERS (sore throat, sores in mouth, or a toothache) UNUSUAL RASH, SWELLING OR PAIN  UNUSUAL VAGINAL DISCHARGE OR ITCHING   Items with * indicate a potential emergency and should be followed up as soon as possible or go to the Emergency Department if any problems should occur.  Please show the CHEMOTHERAPY ALERT CARD or  IMMUNOTHERAPY ALERT CARD at check-in to the Emergency Department and triage nurse.  Should you have questions after your visit or need to cancel or reschedule your appointment, please contact [facility]  Dept: [phone]  and follow the prompts.  Office hours are 8:00 a.m. to 4:30 p.m. Monday - Friday. Please note that voicemails left after 4:00 p.m. may not be returned until the following business day.  We are closed weekends and major holidays. You have access to a nurse at all times for urgent questions. Please call the main number to the clinic Dept: [phone] and follow the prompts.   For any non-urgent questions, you may also contact your provider using MyChart. We now offer e-Visits for anyone 18 and older to request care online for non-urgent symptoms. For details visit [email or web address].   Also download the MyChart app! Go to the app store, search "MyChart", open the app, select [facility], and log in with your MyChart username and password.  Due to Covid, a mask is required upon entering the hospital/clinic. If you do not have a mask, one will be given to you upon arrival. For doctor visits, patients may have 1 support person aged 18 or older with them. For treatment visits, patients cannot have anyone with them due to current Covid guidelines and our immunocompromised population.   

## 2021-03-18 NOTE — Progress Notes (Signed)
Ipilimumab (YERVOY) Patient Monitoring Assessment   Is the patient experiencing any of the following general symptoms?:  [] Difficulty performing normal activities [] Feeling sluggish or cold all the time [] Unusual weight gain [] Constant or unusual headaches [] Feeling dizzy or faint [] Changes in eyesight (blurry vision, double vision, or other vision problems) [] Changes in mood or behavior (ex: decreased sex drive, irritability, or forgetfulness) [] Starting new medications (ex: steroids, other medications that lower immune response) [] Patient is not experiencing any of the general symptoms above.    Gastrointestinal  Patient is having 1-2 bowel movements each day.  Is this different from baseline? [] Yes [x] No Are your stools watery or do they have a foul smell? [] Yes [x] No Have you seen blood in your stools? [] Yes [x] No Are your stools dark, tarry, or sticky? [] Yes [x] No Are you having pain or tenderness in your belly? [] Yes [x] No  Skin Does your skin itch? [x] Yes [] No Do you have a rash? [] Yes [x] No Has your skin blistered and/or peeled? [] Yes [x] No Do you have sores in your mouth? [] Yes [x] No  Hepatic Has your urine been dark or tea colored? [] Yes [x] No Have you noticed that your skin or the whites of your eyes are turning yellow? [] Yes [x] No Are you bleeding or bruising more easily than normal? [] Yes [x] No Are you nauseous and/or vomiting? [] Yes [x] No Do you have pain on the right side of your stomach? [] Yes [x] No  Neurologic  Are you having unusual weakness of legs, arms, or face? [] Yes [x] No Are you having numbness or tingling in your hands or feet? [] Yes [x] No  Ryan Chandler  Pt. states he has itching at times and rash that appears when he scratches. No rash noted now. Per Dr. [name], ok for treatment today.

## 2021-03-18 NOTE — Progress Notes (Signed)
Hematology and Oncology Follow Up Visit  Ryan Chandler [id number] 01/07/1966 55 y.o. 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  He has stage III disease from a presumed subungual primary.   Prior Therapy: He is status post biopsy with ultrasound guidance completed in January 2022 confirmed the presence of melanoma.  Current therapy: Ipilimumab 1 mg/kg and nivolumab 3 mg/kg neoadjuvant treatment started on January 14, 2021.  He is here for cycle 4 of therapy.  Interim History: Mr. Warne presents today for return evaluation.  Since the last visit, he reports no major changes in his health.  He has tolerated therapy without any major complaints at this time.  He denies any nausea, vomiting or abdominal pain.  He does report pruritus no skin rash or lesions.  He denies any diarrhea or changes in his bowels.  He denies any cough wheezing or respiratory complaints.  Continues to work full-time without any decline in his ability to do so.     Medications: Updated on review. Current Outpatient Medications  Medication Sig Dispense Refill  . aspirin 81 MG EC tablet     . Blood Glucose Monitoring Suppl (ONE TOUCH ULTRA 2) w/Device KIT     . empagliflozin (JARDIANCE) 25 MG TABS tablet Take 25 mg by mouth daily.    . ergocalciferol (VITAMIN D2) 1.25 MG (50000 UT) capsule Take 1 capsule by mouth once a week.    . insulin glargine, 1 Unit Dial, (TOUJEO SOLOSTAR) 300 UNIT/ML Solostar Pen     . lisinopril (PRINIVIL,ZESTRIL) 5 MG tablet Take 5 mg by mouth daily.    . metFORMIN (GLUCOPHAGE) 1000 MG tablet Take 1,000 mg by mouth 2 (two) times daily.    . ONE TOUCH CLUB LANCETS MISC     . prochlorperazine (COMPAZINE) 10 MG tablet Take 1 tablet (10 mg total) by mouth every 6 (six) hours as needed for nausea or vomiting. 30 tablet 0  .  simvastatin (ZOCOR) 40 MG tablet Take 40 mg by mouth daily.     No current facility-administered medications for this visit.     Allergies: No Known Allergies    Physical Exam:  Blood pressure 132/75, pulse 72, temperature 97.7 F (36.5 C), temperature source Tympanic, resp. rate 18, height _0  (1.88 m), weight 204 lb 12.8 oz (92.9 kg), SpO2 100 %.   ECOG: 0     General appearance: Alert, awake without any distress. Head: Atraumatic without abnormalities Oropharynx: Without any thrush or ulcers. Eyes: No scleral icterus. Lymph nodes:  palpable bulky left axillary lymph node. Heart:regular rate and rhythm, without any murmurs or gallops.   Lung: Clear to auscultation without any rhonchi, wheezes or dullness to percussion. Abdomin: Soft, nontender without any shifting dullness or ascites. Musculoskeletal: No clubbing or cyanosis. Neurological: No motor or sensory deficits. Skin: No rashes or lesions.      Lab Results: Lab Results  Component Value Date   WBC 5.3 02/24/2021   HGB 14.6 02/24/2021   HCT 44.1 02/24/2021   MCV 85.0 02/24/2021   PLT 211 02/24/2021     Chemistry      Component Value Date/Time   NA 134 (L) 02/24/2021 1038   K 4.0 02/24/2021 1038   CL 97 (L) 02/24/2021 1038   CO2 24 02/24/2021 1038   BUN 8 02/24/2021 1038   CREATININE 0.93 02/24/2021 1038  Component Value Date/Time   CALCIUM 8.8 (L) 02/24/2021 1038   ALKPHOS 83 02/24/2021 1038   AST 9 (L) 02/24/2021 1038   ALT 12 02/24/2021 1038   BILITOT 0.5 02/24/2021 1038        Impression and Plan:    55 year old man with:  1.    Advanced melanoma with a bulky left axillary lymphadenopathy diagnosed in January 2022.  He presented with stage III disease from a presumed left subungual primary.  His disease status was updated at this time and treatment options were reviewed.  Risks and benefits of continuing ipilimumab and nivolumab were discussed.  Complications including immune  mediated issues, GI toxicity among others were discussed.  Upon completing cycle 4 of therapy will update his staging scans and potential different salvage therapy may be needed utilizing BRAF targeted therapy if he harbors the appropriate mutation.  We have requested additional molecular testing on his specimen.  If he has excellent response to therapy surgical resection and lymph node dissection followed by radiation could be a possibility.  His case will be discussed at the melanoma tumor board after obtaining imaging studies and obtaining his molecular testing.  2.  IV access: Peripheral veins are currently in use and Port-A-Cath has been deferred.   3.  Immune mediated complications: These complications including pneumonitis, colitis, thyroid disease and hypophysitis.  4.  Antiemetics: Compazine is available to him without any nausea or vomiting.  5.  Follow-up: He will return in 3 to 4 weeks after updating his staging scans.   30  minutes were dedicated to this visit.  The time was spent on reviewing disease status, treatment options and outlining future plan of care.Zola Button, MD 4/29/20228:33 AM

## 2021-04-06 ENCOUNTER — Other Ambulatory Visit: Payer: Self-pay | Admitting: Oncology

## 2021-04-06 ENCOUNTER — Telehealth: Payer: Self-pay | Admitting: *Deleted

## 2021-04-06 ENCOUNTER — Ambulatory Visit (HOSPITAL_COMMUNITY): Payer: Managed Care, Other (non HMO)

## 2021-04-06 DIAGNOSIS — C439 Malignant melanoma of skin, unspecified: Secondary | ICD-10-CM

## 2021-04-06 NOTE — Telephone Encounter (Signed)
Patient contacted, no answer, left message regarding [facility] denying his PET scan, Dr. [name] has ordered a CT scan instead.  CT can be scheduled as soon as prior authorization is obtained.  Patient instructed to call office with any questions/concerns [phone].

## 2021-04-06 NOTE — Telephone Encounter (Signed)
-----   Message from April H Pait sent at 04/06/2021 11:46 AM EDT ----- Regarding: RE: [facility] PET Denial Pet has been cancelled. Please notify the pt, ty  ----- Message ----- From: Aundria Rud Sent: 04/06/2021  11:30 AM EDT To: April H Pait, Rolene Course, RN, # Subject: RE: [facility] Over[facility] PET Denial                         April please cancel PET. Thank You both ----- Message ----- From: [name], MD Sent: 04/06/2021  11:16 AM EDT To: April H Pait, Rolene Course, RN, Aundria Rud Subject: RE: [facility] Overlie PET Denial                         Cancel PET. I will order CT ----- Message ----- From: Aundria Rud Sent: 04/06/2021  10:49 AM EDT To: April H Pait, Rolene Course, RN, # Subject: Carleene Overlie PET Denial                             Good Morning,  [facility] has denied the PET for this patient for the following reason(s):  Based on [facility] Oncology Imaging Guidelines Section(s): ONC 5.3 Melanoma - Restaging/Recurrence, we cannot approve this request.   Your records show that you are being treated for skin cancer. The request cannot be approved because:  We did not receive results of standard imaging that were unclear.   Please let me know how you would like to move forward   [facility]

## 2021-04-08 ENCOUNTER — Other Ambulatory Visit: Payer: Self-pay | Admitting: Oncology

## 2021-04-08 ENCOUNTER — Telehealth: Payer: Self-pay | Admitting: *Deleted

## 2021-04-08 NOTE — Telephone Encounter (Signed)
Copy of PET from 12/14/20 - [facility], faxed to [facility].  Receipt confirmation received.

## 2021-04-08 NOTE — Progress Notes (Signed)
The patient's case was discussed at the melanoma tumor board including review of his imaging studies as well as pathology results.  He is tumor is BRAF mutated and the plan is to proceed with more oral targeted therapy for 2-3 cycles before repeat imaging studies.  Dr. Tery Sanfilippo recommended obtaining CTA of the chest and axilla with venous phase to evaluate the vasculature of his right axilla before attempted surgical resection.

## 2021-04-12 ENCOUNTER — Ambulatory Visit (HOSPITAL_COMMUNITY): Payer: Managed Care, Other (non HMO)

## 2021-04-15 ENCOUNTER — Ambulatory Visit (HOSPITAL_COMMUNITY): Payer: Managed Care, Other (non HMO)

## 2021-04-15 ENCOUNTER — Ambulatory Visit (HOSPITAL_COMMUNITY)
Admission: RE | Admit: 2021-04-15 | Discharge: 2021-04-15 | Disposition: A | Discharge status: Home / Self Care | Payer: Managed Care, Other (non HMO) | Source: Ambulatory Visit | Attending: Oncology | Admitting: Oncology

## 2021-04-15 ENCOUNTER — Telehealth: Payer: Self-pay | Admitting: Pharmacist

## 2021-04-15 ENCOUNTER — Encounter: Payer: Self-pay | Admitting: Oncology

## 2021-04-15 ENCOUNTER — Other Ambulatory Visit: Payer: Self-pay

## 2021-04-15 ENCOUNTER — Telehealth: Payer: Self-pay

## 2021-04-15 ENCOUNTER — Inpatient Hospital Stay: Payer: Managed Care, Other (non HMO)

## 2021-04-15 ENCOUNTER — Ambulatory Visit: Payer: Managed Care, Other (non HMO)

## 2021-04-15 ENCOUNTER — Inpatient Hospital Stay: Payer: Managed Care, Other (non HMO) | Attending: Oncology | Admitting: Oncology

## 2021-04-15 ENCOUNTER — Other Ambulatory Visit (HOSPITAL_COMMUNITY): Payer: Self-pay

## 2021-04-15 VITALS — BP 124/73 | HR 72 | Temp 97.8°F | Resp 18 | Wt 204.3 lb

## 2021-04-15 DIAGNOSIS — C439 Malignant melanoma of skin, unspecified: Secondary | ICD-10-CM

## 2021-04-15 MED ORDER — DABRAFENIB MESYLATE 75 MG PO CAPS: 150.0000 mg | ORAL_CAPSULE | Freq: Two times a day (BID) | ORAL | 1 refills | Status: DC

## 2021-04-15 MED ORDER — TRAMETINIB DIMETHYL SULFOXIDE 2 MG PO TABS: 2.0000 mg | ORAL_TABLET | Freq: Every day | ORAL | 1 refills | Status: DC

## 2021-04-15 MED ORDER — TRAMETINIB DIMETHYL SULFOXIDE 2 MG PO TABS
2.0000 mg | ORAL_TABLET | Freq: Every day | ORAL | 1 refills | Status: DC
  Filled 2021-04-15: qty 30, 30d supply, fill #0

## 2021-04-15 MED ORDER — DABRAFENIB MESYLATE 75 MG PO CAPS
150.0000 mg | ORAL_CAPSULE | Freq: Two times a day (BID) | ORAL | 1 refills | Status: DC
  Filled 2021-04-15: qty 120, 30d supply, fill #0

## 2021-04-15 NOTE — Telephone Encounter (Signed)
Oral Oncology Patient Advocate Encounter  Prior Authorization for Mekinist has been approved.    PA# [id number] Effective dates: 03/16/21 through 04/14/24  Patient must fill at [facility] will continue to follow.   [place] Patient [place] Phone [phone] Fax [phone] 04/15/2021 3:48 PM

## 2021-04-15 NOTE — Telephone Encounter (Signed)
Oral Oncology Patient Advocate Encounter  Received notification from [employer] that prior authorization for Mekinist is required.  PA submitted on CoverMyMeds Key [id number] Status is pending  Oral Oncology Clinic will continue to follow.  [place] Patient [place] Phone [phone] Fax [phone] 04/15/2021 3:45 PM

## 2021-04-15 NOTE — Progress Notes (Signed)
Hematology and Oncology Follow Up Visit  Ryan Chandler [id number] Jun 06, 1966 55 y.o. 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  His tumor is BRAF mutated.  Prior Therapy:    He is status post biopsy with ultrasound guidance completed in January 2022 confirmed the presence of melanoma.   Ipilimumab 1 mg/kg and nivolumab 3 mg/kg neoadjuvant treatment started on January 14, 2021.  He completed 4 cycles of therapy  Current therapy: Under evaluation for additional therapy.  Interim History: [name] returns today for a follow-up visit.  Since her last visit, he reports no major changes in his health.  He denies any complications related to immunotherapy except for mild pruritus and dermatitis.  He denies any nausea, vomiting or respiratory complaints.  His performance status quality of life remain excellent.     Medications: Reviewed without changes. Current Outpatient Medications  Medication Sig Dispense Refill  . aspirin 81 MG EC tablet     . Blood Glucose Monitoring Suppl (ONE TOUCH ULTRA 2) w/Device KIT     . empagliflozin (JARDIANCE) 25 MG TABS tablet Take 25 mg by mouth daily.    . ergocalciferol (VITAMIN D2) 1.25 MG (50000 UT) capsule Take 1 capsule by mouth once a week.    . insulin glargine, 1 Unit Dial, (TOUJEO SOLOSTAR) 300 UNIT/ML Solostar Pen     . lisinopril (PRINIVIL,ZESTRIL) 5 MG tablet Take 5 mg by mouth daily.    . metFORMIN (GLUCOPHAGE) 1000 MG tablet Take 1,000 mg by mouth 2 (two) times daily.    . ONE TOUCH CLUB LANCETS MISC     . prochlorperazine (COMPAZINE) 10 MG tablet Take 1 tablet (10 mg total) by mouth every 6 (six) hours as needed for nausea or vomiting. 30 tablet 0  . simvastatin (ZOCOR) 40 MG tablet Take 40 mg by mouth daily.     No current facility-administered medications for this visit.      Allergies: No Known Allergies    Physical Exam:  Blood pressure 124/73, pulse 72, temperature 97.8 F (36.6 C), temperature source Tympanic, resp. rate 18, weight 204 lb 4.8 oz (92.7 kg), SpO2 99 %.   ECOG: 0    General appearance: Comfortable appearing without any discomfort Head: Normocephalic without any trauma Oropharynx: Mucous membranes are moist and pink without any thrush or ulcers. Eyes: Pupils are equal and round reactive to light. Lymph nodes:  Bulky left axillary lymph node unchanged on examination. Heart:regular rate and rhythm.  S1 and S2 without leg edema. Lung: Clear without any rhonchi or wheezes.  No dullness to percussion. Abdomin: Soft, nontender, nondistended with good bowel sounds.  No hepatosplenomegaly. Musculoskeletal: No joint deformity or effusion.  Full range of motion noted. Neurological: No deficits noted on motor, sensory and deep tendon reflex exam. Skin: No petechial rash or dryness.  Appeared moist.        Lab Results: Lab Results  Component Value Date   WBC 7.3 03/18/2021   HGB 14.5 03/18/2021   HCT 43.7 03/18/2021   MCV 84.2 03/18/2021   PLT 220 03/18/2021     Chemistry      Component Value Date/Time   NA 135 03/18/2021 0837   K 4.0 03/18/2021 0837   CL 97 (L) 03/18/2021 0837   CO2 26 03/18/2021 0837   BUN 11 03/18/2021 0837   CREATININE 0.81 03/18/2021 0837  Component Value Date/Time   CALCIUM 9.3 03/18/2021 0837   ALKPHOS 76 03/18/2021 0837   AST 14 (L) 03/18/2021 0837   ALT 15 03/18/2021 0837   BILITOT 0.6 03/18/2021 0837        Impression and Plan:    55 year old man with:  1.  Stage III with a bulky left axillary lymphadenopathy diagnosed in January 2022.    He is status post 4 cycles of immunotherapy without any meaningful clinical response to his axillary mass.  He is undergoing a CT scan staging today to confirm these findings.  If his scan does not show meaningful regression I would be in  favor of switching him to BRAF targeted therapy and attempt to reduce the bulk of his lymph node and attempt to surgical resection.  Upon completing few cycles of therapy will reimage him including CT angiogram with venous phase to assess any invasion of the venous structure.  His case was discussed at the melanoma tumor board including review of imaging studies as well as discussion with Dr. Barry Dienes regarding surgical approach.  He is in favor of further reduction of his tumor prior to proceeding with any surgical resection.  After discussion today he is agreeable to proceed with BRAF targeted therapy.  Combination of dabrafenib at 150 mg twice daily with trametinib 2 mg daily will be started in the near future.  Complication associated with this treatment include nausea, fatigue, fevers, skin rash, cardiac complications and hyperglycemia.  2.  IV access: Port-A-Cath has not been needed.  Peripheral veins currently in use.   3.  Immune mediated complications: He has not experienced any issues at this time including pneumonitis, colitis and thyroid disease.  4.  Antiemetics: No nausea or vomiting reported at this time.  Compazine is available to him.  5.  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  Previous biopsy has been negative however.  6.  Follow-up: He will return in the next 4 to 6 weeks to follow his progress.   30  minutes were dedicated to this encounter.  The time was spent on reviewing disease status, discussing treatment options and future plan of care review.   Zola Button, MD 5/27/20222:26 PM

## 2021-04-15 NOTE — Telephone Encounter (Signed)
Oral Oncology Patient Advocate Encounter  Received notification from [employer] that prior authorization for Ryan Chandler is required.  PA submitted on CoverMyMeds Key [id number] Status is pending  Oral Oncology Clinic will continue to follow.  [name] Phone [phone] Fax [phone] 04/15/2021 3:43 PM

## 2021-04-15 NOTE — Telephone Encounter (Signed)
Oral Oncology Pharmacist Encounter  Received new prescriptions for Tafinlar (dabrafenib) and Mekinist (trametinib) for the treatment of BRAF V600K mutated stage III melanoma, planned duration until disease progression or unacceptable drug toxicity.  Prescription dose and frequency assessed for appropriateness. Appropriate for therapy initiation.   CBC w/ Diff and CMP from 03/18/21 assessed, no dose adjustments required, noted pt with glucose of 203 mg/dL (pt with PMH significant for T2DM), will need close monitoring since Tafinlar can cause hyperglycemia.  Current medication list in Epic reviewed, DDIs with Tafinlar identified:  Given the hyperglycemic effects of Tafinlar, this may diminish the efficacy of the patient's anti-hyperglycemic agents including metformin, Jardiance, and insulin glargine. As mentioned above recommend close monitoring of blood glucose once therapy is initiated as patient may need dose adjustments made to anti-hyperglycemic regimen.  Evaluated chart and no patient barriers to medication adherence noted.   Patient agreement for treatment documented in MD note on 04/15/21.  Patient's insurance requires that prescriptions be filled through [facility]. Prescriptions redirected.   Oral Oncology Clinic will continue to follow for insurance authorization, copayment issues, initial counseling and start date.  Leron Croak, PharmD, BCPS Hematology/Oncology Clinical Pharmacist [facility] [phone] 04/15/2021 3:43 PM

## 2021-04-15 NOTE — Telephone Encounter (Signed)
Oral Oncology Patient Advocate Encounter  Prior Authorization for Ryan Chandler has been approved.    PA# [id number] Effective dates: 03/16/21 through 04/15/24  Patient must fill  At [facility] will continue to follow.   [place] Patient [place] Phone [phone] Fax [phone] 04/15/2021 3:50 PM

## 2021-04-19 ENCOUNTER — Other Ambulatory Visit (HOSPITAL_COMMUNITY): Payer: Self-pay

## 2021-04-21 NOTE — Telephone Encounter (Signed)
Oral Chemotherapy Pharmacist Encounter   Attempted to reach patient to provide update and offer for initial counseling on oral medication: Tafinlar (dabrafenib) and Mekinist (trametinib).   No answer. Left voicemail for patient to call back to discuss details of medication acquisition and initial counseling session.  Leron Croak, PharmD, BCPS Hematology/Oncology Clinical Pharmacist [facility] [phone] 04/21/2021 9:17 AM

## 2021-04-21 NOTE — Telephone Encounter (Signed)
Oral Chemotherapy Pharmacist Encounter   Spoke with patient today to follow up regarding patient's oral chemotherapy medication: Tafinlar (dabrafenib) and Mekinist (trametinib)  Patient confirmed he has not yet received a phone call from [facility] to set up shipment as of this AM. Pharmacy likely behind on setting up shipment due to being closed on 04/18/21. Provided patient with the direct phone number for [facility] [phone]) to set up medication shipment.   Patient knows to call the office with questions or concerns.  Leron Croak, PharmD, BCPS Hematology/Oncology Clinical Pharmacist [facility] [phone] 04/21/2021 10:03 AM

## 2021-04-22 NOTE — Telephone Encounter (Signed)
Oral Chemotherapy Pharmacist Encounter   Attempted to reach patient to provide update and offer for initial counseling on oral medication: Tafinlar (dabrafenib) and Mekinist (trametinib).   No answer. Left voicemail for patient to call back to discuss details of medication and initial counseling session. Of note, did received notification from [facility] that shipment has been set up with patient for medications to be shipped out 04/25/21.  Leron Croak, PharmD, BCPS Hematology/Oncology Clinical Pharmacist [facility] [phone] 04/22/2021 10:12 AM

## 2021-04-22 NOTE — Telephone Encounter (Signed)
Oral Chemotherapy Pharmacist Encounter   I spoke with patient for overview of: Tafinlar and Mekinist combination for the treatment of BRAF V600K mutated stage III melanoma, planned duration until disease progression or unacceptable drug toxicity.  Counseled patient on administration, dosing, side effects, monitoring, drug-food interactions, safe handling, storage, and disposal.  Patient will take Tafinlar 50m capsules, 2 capsules (15558m by mouth twice daily.   Patient will take Mekinist 58m50mablets,1 tablet by mouth once daily.   These will be taken on an empty stomach, 1 hour before or 2 hours after a meal.   Patient will take Tafinlar and Mekinist in the morning before breakfast, and will take Tafinlar again in the evening before bed, 2 hours after dinner.  Mekinist will be stored in the refrigerator.  Tafinlar and Mekinist start date: 04/27/21   Adverse effects include but are not limited to: drug fever, rash, nausea, vomiting, diarrhea, peripheral edema, decreased LVEF, hyperglycemia, and pulmonary toxicity.   Patient instructed that febrile reactions can occur with Tafinlar and Mekinist therapy.  Patient instructed to interrupt therapy for any fever over 101.71F and to alert the office.  Patient has anti-emetic on hand and knows to take it if nausea develops.   Patient will obtain anti diarrheal and alert the office of 4 or more loose stools above baseline.  Reviewed with patient importance of keeping a medication schedule and plan for any missed doses. No barriers to medication adherence identified.  Medication reconciliation performed and medication/allergy list updated.   [employer] been obtained. Patient's insurance [facility] stated the pharmacy obtained a copay card to help reduce his out of pocket cost. This will ship from [facility] 04/25/21 to  deliver to patient's home on 04/26/21.  All questions answered.  Mr. ElsKageliced understanding and appreciation.   Medication education handout placed in mail for patient. Patient knows to call the office with questions or concerns. Oral Chemotherapy Clinic phone number provided to patient.    RebLeron CroakharmD, BCPS Hematology/Oncology Clinical Pharmacist [phone]/2022 11:47 AM

## 2021-04-26 ENCOUNTER — Other Ambulatory Visit: Payer: Self-pay | Admitting: Oncology

## 2021-04-26 MED ORDER — TRIAMCINOLONE ACETONIDE 0.5 % EX OINT: 1.0000 "application " | TOPICAL_OINTMENT | Freq: Two times a day (BID) | CUTANEOUS | 0 refills | Status: DC

## 2021-04-27 ENCOUNTER — Telehealth: Payer: Self-pay | Admitting: Oncology

## 2021-04-27 NOTE — Telephone Encounter (Signed)
Scheduled per 05/31 los, patient has been called and notified. 

## 2021-05-19 ENCOUNTER — Inpatient Hospital Stay (HOSPITAL_BASED_OUTPATIENT_CLINIC_OR_DEPARTMENT_OTHER): Payer: Managed Care, Other (non HMO) | Admitting: Oncology

## 2021-05-19 ENCOUNTER — Inpatient Hospital Stay: Payer: Managed Care, Other (non HMO) | Attending: Oncology

## 2021-05-19 ENCOUNTER — Other Ambulatory Visit: Payer: Self-pay

## 2021-05-19 VITALS — BP 144/82 | HR 72 | Temp 99.1°F | Resp 17 | Ht 74.0 in | Wt 200.5 lb

## 2021-05-19 DIAGNOSIS — C439 Malignant melanoma of skin, unspecified: Secondary | ICD-10-CM

## 2021-05-19 DIAGNOSIS — C493 Malignant neoplasm of connective and soft tissue of thorax: Secondary | ICD-10-CM | POA: Diagnosis present

## 2021-05-19 LAB — CMP (CANCER CENTER ONLY)
ALT: 11 U/L (ref 0–44)
AST: 11 U/L — ABNORMAL LOW (ref 15–41)
Albumin: 3.7 g/dL (ref 3.5–5.0)
Alkaline Phosphatase: 85 U/L (ref 38–126)
Anion gap: 10 (ref 5–15)
BUN: 12 mg/dL (ref 6–20)
CO2: 28 mmol/L (ref 22–32)
Calcium: 9.7 mg/dL (ref 8.9–10.3)
Chloride: 97 mmol/L — ABNORMAL LOW (ref 98–111)
Creatinine: 0.93 mg/dL (ref 0.61–1.24)
GFR, Estimated: >60 mL/min (ref >60)
Glucose, Bld: 360 mg/dL — ABNORMAL HIGH (ref 70–99)
Potassium: 4.2 mmol/L (ref 3.5–5.1)
Sodium: 135 mmol/L (ref 135–145)
Total Bilirubin: 0.3 mg/dL (ref 0.3–1.2)
Total Protein: 7.3 g/dL (ref 6.5–8.1)

## 2021-05-19 LAB — CBC WITH DIFFERENTIAL (CANCER CENTER ONLY)
Abs Immature Granulocytes: 0.02 10*3/uL (ref 0.00–0.07)
Basophils Absolute: 0 10*3/uL (ref 0.0–0.1)
Basophils Relative: 1 %
Eosinophils Absolute: 0.2 10*3/uL (ref 0.0–0.5)
Eosinophils Relative: 5 %
HCT: 45 % (ref 39.0–52.0)
Hemoglobin: 15.3 g/dL (ref 13.0–17.0)
Immature Granulocytes: 0 %
Lymphocytes Relative: 31 %
Lymphs Abs: 1.6 10*3/uL (ref 0.7–4.0)
MCH: 28.7 pg (ref 26.0–34.0)
MCHC: 34 g/dL (ref 30.0–36.0)
MCV: 84.4 fL (ref 80.0–100.0)
Monocytes Absolute: 0.5 10*3/uL (ref 0.1–1.0)
Monocytes Relative: 10 %
Neutro Abs: 2.7 10*3/uL (ref 1.7–7.7)
Neutrophils Relative %: 53 %
Platelet Count: 200 10*3/uL (ref 150–400)
RBC: 5.33 MIL/uL (ref 4.22–5.81)
RDW: 12.3 % (ref 11.5–15.5)
WBC Count: 5.1 10*3/uL (ref 4.0–10.5)
nRBC: 0 % (ref 0.0–0.2)

## 2021-05-19 MED ORDER — TRAMETINIB DIMETHYL SULFOXIDE 2 MG PO TABS: 2.0000 mg | ORAL_TABLET | Freq: Every day | ORAL | 1 refills | Status: DC

## 2021-05-19 MED ORDER — DABRAFENIB MESYLATE 75 MG PO CAPS: 150.0000 mg | ORAL_CAPSULE | Freq: Two times a day (BID) | ORAL | 1 refills | Status: DC

## 2021-05-19 NOTE — Progress Notes (Signed)
Hematology and Oncology Follow Up Visit  Ryan Chandler [id number] [birth year] 55 y.o. 05/19/2021 2:59 PM [name], [name], [name], MD   Principle Diagnosis: 55 year old man with stage III melanoma with bulky left axillary lymph node diagnosed in January 2022.  Primary is unclear with possible subungual etiology.   Prior Therapy:   He is status post biopsy with ultrasound guidance completed in January 2022 confirmed the presence of melanoma.  Ipilimumab 1 mg/kg and nivolumab 3 mg/kg neoadjuvant treatment started on January 14, 2021.  He completed 4 cycles of therapy with stable disease   Current therapy: Tafinlar 26m capsules, 2 capsules (1555m by mouth twice daily with  Mekinist 15m64mablets,1 tablet by mouth once daily started on April 27, 2021.  Interim History: Mr. ElsSwamyturns today for a follow-up visit.  Since the last visit, he reports no major changes in his health.  He started BRAF targeted therapy and taking it for the last 3 weeks without any major complications.  He has reported increased fatigue and tiredness but no nausea, vomiting or abdominal pain.  He denies any skin rashes or lesions.  He denies any fevers.     Medications: Unchanged on review. Current Outpatient Medications  Medication Sig Dispense Refill   aspirin 81 MG EC tablet      Blood Glucose Monitoring Suppl (ONE TOUCH ULTRA 2) w/Device KIT      dabrafenib mesylate (TAFINLAR) 75 MG capsule Take 2 capsules (150 mg total) by mouth 2 (two) times daily. Take on an empty stomach 1 hour before or 2 hours after meals. 120 capsule 1   empagliflozin (JARDIANCE) 25 MG TABS tablet Take 25 mg by mouth daily.     ergocalciferol (VITAMIN D2) 1.25 MG (50000 UT) capsule Take 1 capsule by mouth once a week.     insulin glargine, 1 Unit Dial, (TOUJEO SOLOSTAR) 300 UNIT/ML Solostar Pen      lisinopril (PRINIVIL,ZESTRIL) 5 MG tablet Take 5 mg by mouth daily.     metFORMIN (GLUCOPHAGE) 1000 MG tablet Take 1,000 mg  by mouth 2 (two) times daily.     ONE TOUCH CLUB LANCETS MISC      prochlorperazine (COMPAZINE) 10 MG tablet Take 1 tablet (10 mg total) by mouth every 6 (six) hours as needed for nausea or vomiting. 30 tablet 0   simvastatin (ZOCOR) 40 MG tablet Take 40 mg by mouth daily.     trametinib dimethyl sulfoxide (MEKINIST) 2 MG tablet Take 1 tablet (2 mg total) by mouth daily. Take 1 hour before or 2 hours after a meal. Store refrigerated in original container. 30 tablet 1   triamcinolone ointment (KENALOG) 0.5 % Apply 1 application topically 2 (two) times daily. 30 g 0   No current facility-administered medications for this visit.     Allergies: No Known Allergies    Physical Exam:  Blood pressure (!) 144/82, pulse 72, temperature 99.1 F (37.3 C), temperature source Oral, resp. rate 17, height 6' 2" (1.88 m), weight 200 lb 8 oz (90.9 kg), SpO2 100 %.   ECOG: 0   General appearance: Comfortable appearing without any discomfort Head: Normocephalic without any trauma Oropharynx: Mucous membranes are moist and pink without any thrush or ulcers. Eyes: Pupils are equal and round reactive to light. Lymph nodes: No cervical, supraclavicular, inguinal or axillary lymphadenopathy.   Heart:regular rate and rhythm.  S1 and S2 without leg edema. Lung: Clear without any rhonchi or wheezes.  No dullness to percussion. Abdomin: Soft, nontender, nondistended with good  bowel sounds.  No hepatosplenomegaly. Musculoskeletal: No joint deformity or effusion.  Full range of motion noted. Neurological: No deficits noted on motor, sensory and deep tendon reflex exam. Skin: No petechial rash or dryness.  Appeared moist.        Lab Results: Lab Results  Component Value Date   WBC 5.1 05/19/2021   HGB 15.3 05/19/2021   HCT 45.0 05/19/2021   MCV 84.4 05/19/2021   PLT 200 05/19/2021     Chemistry      Component Value Date/Time   NA 135 03/18/2021 0837   K 4.0 03/18/2021 0837   CL 97 (L)  03/18/2021 0837   CO2 26 03/18/2021 0837   BUN 11 03/18/2021 0837   CREATININE 0.81 03/18/2021 0837      Component Value Date/Time   CALCIUM 9.3 03/18/2021 0837   ALKPHOS 76 03/18/2021 0837   AST 14 (L) 03/18/2021 0837   ALT 15 03/18/2021 0837   BILITOT 0.6 03/18/2021 0837      IMPRESSION: 1. Dominant mass in the left axilla is unchanged in size, measuring 6.3 x 4.8 cm. An adjacent mass or lymph node is however increased size measuring 3.4 x 2.5 cm, previously 2.0 x 1.4 cm. Findings are consistent with worsened nodal metastasis of melanoma. 2. No noncontrast evidence of metastatic disease in the abdomen or pelvis. Please note that noncontrast CT is significantly limited in sensitivity for the assessment of solid masses. 3. Mild, diffuse bilateral bronchial wall thickening, consistent with nonspecific infectious or inflammatory bronchitis. Background of very fine centrilobular nodularity throughout the lungs, likely smoking-related respiratory bronchiolitis. 4. Cholelithiasis.  Impression and Plan:    55 year old man with:   1.    Stage III melanoma presented with a bulky left axillary lymphadenopathy diagnosed in January 2022.    He is currently on BRAF targeted therapy since April 27, 2021 after immunotherapy failed response.  CT scan on Apr 15, 2021 was personally reviewed and his case was also discussed at the melanoma tumor board.  At this time, I have recommended continuing BRAF targeted therapy for few more cycles before assessing response.  After 3 weeks of therapy he has not had any adequate response by physical examination but this will continue to be monitored.  The goal is to try to shrink this tumor with BRAF targeted therapy prior to attempted surgical resection.  Given the size of the tumor and the vascular involvement, surgical intervention at this time will be very problematic.   2.  Work-related considerations: He is experiencing more fatigue and tiredness and  difficulty to perform his physical job.  He would benefit from short-term disability till his treatment is completed.  We will support his claim if he decides to proceed that route.   3.  Antiemetics: No nausea or vomiting reported at this time.  Compazine is available to him.   4.  Follow-up: We will be in the next 4 to 5 weeks for repeat follow-up.   30  minutes were spent on this encounter.  The time was dedicated to reviewing laboratory data, disease status update, treatment choices and outlining future plan of care.   Zola Button, MD 6/30/20222:59 PM

## 2021-05-20 LAB — TSH: TSH: 1.78 u[IU]/mL (ref 0.320–4.118)

## 2021-05-25 ENCOUNTER — Encounter: Payer: Self-pay | Admitting: Oncology

## 2021-05-25 ENCOUNTER — Telehealth: Payer: Self-pay | Admitting: *Deleted

## 2021-05-25 ENCOUNTER — Other Ambulatory Visit (HOSPITAL_COMMUNITY): Payer: Self-pay

## 2021-05-25 ENCOUNTER — Other Ambulatory Visit: Payer: Self-pay | Admitting: *Deleted

## 2021-05-25 DIAGNOSIS — C439 Malignant melanoma of skin, unspecified: Secondary | ICD-10-CM

## 2021-05-25 MED ORDER — DABRAFENIB MESYLATE 75 MG PO CAPS
150.0000 mg | ORAL_CAPSULE | Freq: Two times a day (BID) | ORAL | 0 refills | Status: DC
  Filled 2021-05-25: qty 120, 30d supply, fill #0

## 2021-05-25 MED ORDER — TRAMETINIB DIMETHYL SULFOXIDE 2 MG PO TABS
2.0000 mg | ORAL_TABLET | Freq: Every day | ORAL | 0 refills | Status: DC
  Filled 2021-05-25 (×2): qty 30, 30d supply, fill #0

## 2021-05-25 NOTE — Telephone Encounter (Signed)
Returned PC to patient - he reports he is having trouble obtaining his medications Tafinlar & Mekinist from [facility] due to an unpaid bill.  Patient states he only has enough doses for today.  RN contacted R. Thomasene Lot - she suggested sending one time rx to [facility] - cost would be covered by one-time trial card.  Rx sent, patient informed of rx sent to [facility].  Patient verbalizes understanding.

## 2021-05-26 ENCOUNTER — Other Ambulatory Visit (HOSPITAL_COMMUNITY): Payer: Self-pay

## 2021-06-02 ENCOUNTER — Other Ambulatory Visit (HOSPITAL_COMMUNITY): Payer: Self-pay

## 2021-06-21 ENCOUNTER — Telehealth: Payer: Self-pay | Admitting: *Deleted

## 2021-06-21 ENCOUNTER — Telehealth: Payer: Self-pay | Admitting: Oncology

## 2021-06-21 NOTE — Telephone Encounter (Signed)
Called pt to r/s appt per 8/2 sch msg. After speaking to the pt he has decided to keep originally scheduled appt.

## 2021-06-21 NOTE — Telephone Encounter (Signed)
Returned PC to patient, he was asking about upcoming appointments.  Informed him he has appointments on 07/01/21, patient states he has to work that day & cannot come.  Scheduling message sent to contact patient to reschedule.  Patient verbalizes understanding.

## 2021-06-24 ENCOUNTER — Telehealth: Payer: Self-pay | Admitting: Oncology

## 2021-06-24 NOTE — Telephone Encounter (Signed)
Called patient regarding upcoming 08/12 appointment, left a voicemail.

## 2021-07-01 ENCOUNTER — Inpatient Hospital Stay: Payer: Managed Care, Other (non HMO) | Attending: Oncology

## 2021-07-01 ENCOUNTER — Other Ambulatory Visit: Payer: Self-pay

## 2021-07-01 ENCOUNTER — Other Ambulatory Visit (HOSPITAL_COMMUNITY): Payer: Self-pay

## 2021-07-01 ENCOUNTER — Telehealth: Payer: Self-pay

## 2021-07-01 ENCOUNTER — Inpatient Hospital Stay (HOSPITAL_BASED_OUTPATIENT_CLINIC_OR_DEPARTMENT_OTHER): Payer: Managed Care, Other (non HMO) | Admitting: Oncology

## 2021-07-01 ENCOUNTER — Encounter: Payer: Self-pay | Admitting: Oncology

## 2021-07-01 VITALS — BP 139/84 | HR 77 | Temp 97.9°F | Resp 18 | Ht 74.0 in | Wt 197.3 lb

## 2021-07-01 DIAGNOSIS — R21 Rash and other nonspecific skin eruption: Secondary | ICD-10-CM | POA: Insufficient documentation

## 2021-07-01 DIAGNOSIS — C493 Malignant neoplasm of connective and soft tissue of thorax: Secondary | ICD-10-CM | POA: Diagnosis present

## 2021-07-01 DIAGNOSIS — C439 Malignant melanoma of skin, unspecified: Secondary | ICD-10-CM

## 2021-07-01 DIAGNOSIS — C773 Secondary and unspecified malignant neoplasm of axilla and upper limb lymph nodes: Secondary | ICD-10-CM | POA: Insufficient documentation

## 2021-07-01 LAB — CBC WITH DIFFERENTIAL (CANCER CENTER ONLY)
Abs Immature Granulocytes: 0.03 10*3/uL (ref 0.00–0.07)
Basophils Absolute: 0 10*3/uL (ref 0.0–0.1)
Basophils Relative: 0 %
Eosinophils Absolute: 0.2 10*3/uL (ref 0.0–0.5)
Eosinophils Relative: 5 %
HCT: 40.6 % (ref 39.0–52.0)
Hemoglobin: 13.5 g/dL (ref 13.0–17.0)
Immature Granulocytes: 1 %
Lymphocytes Relative: 23 %
Lymphs Abs: 1.2 10*3/uL (ref 0.7–4.0)
MCH: 28 pg (ref 26.0–34.0)
MCHC: 33.3 g/dL (ref 30.0–36.0)
MCV: 84.2 fL (ref 80.0–100.0)
Monocytes Absolute: 0.4 10*3/uL (ref 0.1–1.0)
Monocytes Relative: 8 %
Neutro Abs: 3.3 10*3/uL (ref 1.7–7.7)
Neutrophils Relative %: 63 %
Platelet Count: 228 10*3/uL (ref 150–400)
RBC: 4.82 MIL/uL (ref 4.22–5.81)
RDW: 12.3 % (ref 11.5–15.5)
WBC Count: 5.2 10*3/uL (ref 4.0–10.5)
nRBC: 0 % (ref 0.0–0.2)

## 2021-07-01 LAB — CMP (CANCER CENTER ONLY)
ALT: 13 U/L (ref 0–44)
AST: 12 U/L — ABNORMAL LOW (ref 15–41)
Albumin: 3.3 g/dL — ABNORMAL LOW (ref 3.5–5.0)
Alkaline Phosphatase: 85 U/L (ref 38–126)
Anion gap: 8 (ref 5–15)
BUN: 9 mg/dL (ref 6–20)
CO2: 29 mmol/L (ref 22–32)
Calcium: 9.2 mg/dL (ref 8.9–10.3)
Chloride: 99 mmol/L (ref 98–111)
Creatinine: 0.81 mg/dL (ref 0.61–1.24)
GFR, Estimated: >60 mL/min (ref >60)
Glucose, Bld: 309 mg/dL — ABNORMAL HIGH (ref 70–99)
Potassium: 4.2 mmol/L (ref 3.5–5.1)
Sodium: 136 mmol/L (ref 135–145)
Total Bilirubin: 0.3 mg/dL (ref 0.3–1.2)
Total Protein: 6.9 g/dL (ref 6.5–8.1)

## 2021-07-01 LAB — TSH: TSH: 1.102 u[IU]/mL (ref 0.320–4.118)

## 2021-07-01 MED ORDER — TRAMETINIB DIMETHYL SULFOXIDE 2 MG PO TABS: 2.0000 mg | ORAL_TABLET | Freq: Every day | ORAL | 0 refills | Status: DC

## 2021-07-01 MED ORDER — DABRAFENIB MESYLATE 75 MG PO CAPS
150.0000 mg | ORAL_CAPSULE | Freq: Two times a day (BID) | ORAL | 0 refills | Status: DC
  Filled 2021-07-01: qty 120, 30d supply, fill #0

## 2021-07-01 MED ORDER — DABRAFENIB MESYLATE 75 MG PO CAPS: 150.0000 mg | ORAL_CAPSULE | Freq: Two times a day (BID) | ORAL | 0 refills | Status: DC

## 2021-07-01 MED ORDER — TRAMETINIB DIMETHYL SULFOXIDE 2 MG PO TABS
2.0000 mg | ORAL_TABLET | Freq: Every day | ORAL | 0 refills | Status: DC
  Filled 2021-07-01: qty 30, 30d supply, fill #0

## 2021-07-01 NOTE — Progress Notes (Signed)
Hematology and Oncology Follow Up Visit  Ryan Chandler [id number] Sep 06, 1966 54 y.o. 07/01/2021 11:55 AM [name], [name], Ryan Reaper, MD   Principle Diagnosis: 55 year old man with advanced melanoma presented with bulky left axillary lymph node diagnosed in January 2022.     Prior Therapy:   He is status post biopsy with ultrasound guidance completed in January 2022 confirmed the presence of melanoma.  Ipilimumab 1 mg/kg and nivolumab 3 mg/kg neoadjuvant treatment started on January 14, 2021.  He completed 4 cycles of therapy with stable disease   Current therapy: Tafinlar 56m capsules, 2 capsules (151m by mouth twice daily with  Mekinist 53m69mablets,1 tablet by mouth once daily started on April 27, 2021.  Interim History: Ryan Chandler here for a follow-up visit.  Since her last visit, he stopped taking BRAF targeted therapy after he ran out of his medication and refused refill.  He did develop skin rash after stopping his medication and has been using topical steroids with some improvement.  Clinically otherwise asymptomatic.  He denies any fevers chills or sweats.  He denies any hospitalization or illnesses.     Medications: Reviewed without changes. Current Outpatient Medications  Medication Sig Dispense Refill   aspirin 81 MG EC tablet      Blood Glucose Monitoring Suppl (ONE TOUCH ULTRA 2) w/Device KIT      dabrafenib mesylate (TAFINLAR) 75 MG capsule Take 2 capsules (150 mg total) by mouth 2 (two) times daily. Take on an empty stomach 1 hour before or 2 hours after meals. 120 capsule 0   empagliflozin (JARDIANCE) 25 MG TABS tablet Take 25 mg by mouth daily.     ergocalciferol (VITAMIN D2) 1.25 MG (50000 UT) capsule Take 1 capsule by mouth once a week.     insulin glargine, 1 Unit Dial, (TOUJEO SOLOSTAR) 300 UNIT/ML Solostar Pen      lisinopril (PRINIVIL,ZESTRIL) 5 MG tablet Take 5 mg by mouth daily.     metFORMIN (GLUCOPHAGE) 1000 MG tablet Take 1,000 mg by mouth 2  (two) times daily.     ONE TOUCH CLUB LANCETS MISC      prochlorperazine (COMPAZINE) 10 MG tablet Take 1 tablet (10 mg total) by mouth every 6 (six) hours as needed for nausea or vomiting. 30 tablet 0   simvastatin (ZOCOR) 40 MG tablet Take 40 mg by mouth daily.     trametinib dimethyl sulfoxide (MEKINIST) 2 MG tablet Take 1 tablet (2 mg total) by mouth daily. Take 1 hour before or 2 hours after a meal. Store refrigerated in original container. 30 tablet 0   triamcinolone ointment (KENALOG) 0.5 % Apply 1 application topically 2 (two) times daily. 30 g 0   No current facility-administered medications for this visit.     Allergies: No Known Allergies    Physical Exam:  Blood pressure 139/84, pulse 77, temperature 97.9 F (36.6 C), temperature source Tympanic, resp. rate 18, height _0  (1.88 m), weight 197 lb 4.8 oz (89.5 kg), SpO2 100 %.   ECOG: 0    General appearance: Alert, awake without any distress. Head: Atraumatic without abnormalities Oropharynx: Without any thrush or ulcers. Eyes: No scleral icterus. Lymph nodes: Left axillary lymph node conglomerate.  Unchanged on evaluation. Heart:regular rate and rhythm, without any murmurs or gallops.   Lung: Clear to auscultation without any rhonchi, wheezes or dullness to percussion. Abdomin: Soft, nontender without any shifting dullness or ascites. Musculoskeletal: No clubbing or cyanosis. Neurological: No motor or sensory deficits. Skin: Raised erythematous rash  noted on his upper extremities and trunk.        Lab Results: Lab Results  Component Value Date   WBC 5.2 07/01/2021   HGB 13.5 07/01/2021   HCT 40.6 07/01/2021   MCV 84.2 07/01/2021   PLT 228 07/01/2021     Chemistry      Component Value Date/Time   NA 136 07/01/2021 1109   K 4.2 07/01/2021 1109   CL 99 07/01/2021 1109   CO2 29 07/01/2021 1109   BUN 9 07/01/2021 1109   CREATININE 0.81 07/01/2021 1109      Component Value Date/Time   CALCIUM 9.2  07/01/2021 1109   ALKPHOS 85 07/01/2021 1109   AST 12 (L) 07/01/2021 1109   ALT 13 07/01/2021 1109   BILITOT 0.3 07/01/2021 1109        Impression and Plan:    55 year old man with:   1.    Melanoma of unknown primary presented with bulky left axillary lymph node.  He is status post both immunotherapy as well as BRAF targeted therapy without any major response clinically.  Treatment options were discussed at this time which are limited currently.  I will update his staging scans with CT scan of the chest as well as CT venogram and will refer him to Dr. Barry Dienes for an evaluation for surgical resection.  It would be ideal to shrink his tumor further but unfortunately he is not responding as quickly as needed.  He is agreeable to restart BRAF targeted therapy and will continue to monitor his progress.   2.  Skin rash: Unclear etiology at this time.  I recommended continued topical steroids.  We will avoid systemic steroids given his diabetes.   3.  Antiemetics: Compazine is available to him without any nausea or vomiting.   4.  Follow-up: In the next 4 weeks to follow his progress.   30  minutes were dedicated to this visit.  The time was spent on reviewing laboratory data disease status update and discussing treatment choices.   Ryan Button, MD 8/12/202211:55 AM

## 2021-07-01 NOTE — Telephone Encounter (Signed)
Oral Oncology Pharmacist Encounter  Prescription refills for Mekinist and Tafinlar sent to [facility] in error. Patient receives medication through [facility]. Prescriptions redirected to [facility].  Drema Halon, PharmD Hematology/Oncology Clinical Pharmacist [facility] [phone] 07/01/2021 12:20 PM

## 2021-07-08 ENCOUNTER — Other Ambulatory Visit: Payer: Self-pay

## 2021-07-08 ENCOUNTER — Ambulatory Visit (HOSPITAL_COMMUNITY)
Admission: RE | Admit: 2021-07-08 | Discharge: 2021-07-08 | Disposition: A | Discharge status: Home / Self Care | Payer: Managed Care, Other (non HMO) | Source: Ambulatory Visit | Attending: Oncology | Admitting: Oncology

## 2021-07-08 ENCOUNTER — Encounter (HOSPITAL_COMMUNITY): Payer: Self-pay

## 2021-07-08 DIAGNOSIS — C439 Malignant melanoma of skin, unspecified: Secondary | ICD-10-CM | POA: Insufficient documentation

## 2021-07-08 MED ORDER — IOHEXOL 350 MG/ML SOLN
100.0000 mL | Freq: Once | INTRAVENOUS | Status: AC | PRN
  Administered 2021-07-08: 100 mL via INTRAVENOUS

## 2021-07-19 ENCOUNTER — Other Ambulatory Visit: Payer: Self-pay | Admitting: *Deleted

## 2021-07-19 DIAGNOSIS — C439 Malignant melanoma of skin, unspecified: Secondary | ICD-10-CM

## 2021-07-19 MED ORDER — TRAMETINIB DIMETHYL SULFOXIDE 2 MG PO TABS: 2.0000 mg | ORAL_TABLET | Freq: Every day | ORAL | 0 refills | Status: DC

## 2021-07-19 MED ORDER — DABRAFENIB MESYLATE 75 MG PO CAPS: 150.0000 mg | ORAL_CAPSULE | Freq: Two times a day (BID) | ORAL | 0 refills | Status: DC

## 2021-07-19 NOTE — Telephone Encounter (Signed)
Received faxed refill request from [facility] for Mekinist 2 mg tab and Tafinlar 75 mg capsule.  Refills sent via [facility] per OV note 07/01/21

## 2021-07-27 ENCOUNTER — Encounter: Payer: Self-pay | Admitting: Oncology

## 2021-07-27 ENCOUNTER — Inpatient Hospital Stay: Payer: Managed Care, Other (non HMO) | Attending: Oncology

## 2021-07-27 ENCOUNTER — Telehealth: Payer: Self-pay | Admitting: *Deleted

## 2021-07-27 ENCOUNTER — Other Ambulatory Visit: Payer: Self-pay

## 2021-07-27 ENCOUNTER — Inpatient Hospital Stay (HOSPITAL_BASED_OUTPATIENT_CLINIC_OR_DEPARTMENT_OTHER): Payer: Managed Care, Other (non HMO) | Admitting: Oncology

## 2021-07-27 VITALS — BP 141/75 | HR 84 | Temp 98.5°F | Resp 17 | Ht 74.0 in | Wt 194.9 lb

## 2021-07-27 DIAGNOSIS — C439 Malignant melanoma of skin, unspecified: Secondary | ICD-10-CM | POA: Insufficient documentation

## 2021-07-27 DIAGNOSIS — R21 Rash and other nonspecific skin eruption: Secondary | ICD-10-CM | POA: Diagnosis not present

## 2021-07-27 DIAGNOSIS — C773 Secondary and unspecified malignant neoplasm of axilla and upper limb lymph nodes: Secondary | ICD-10-CM | POA: Diagnosis not present

## 2021-07-27 LAB — CMP (CANCER CENTER ONLY)
ALT: 9 U/L (ref 0–44)
AST: 11 U/L — ABNORMAL LOW (ref 15–41)
Albumin: 3.5 g/dL (ref 3.5–5.0)
Alkaline Phosphatase: 95 U/L (ref 38–126)
Anion gap: 11 (ref 5–15)
BUN: 11 mg/dL (ref 6–20)
CO2: 28 mmol/L (ref 22–32)
Calcium: 9.4 mg/dL (ref 8.9–10.3)
Chloride: 98 mmol/L (ref 98–111)
Creatinine: 0.94 mg/dL (ref 0.61–1.24)
GFR, Estimated: >60 mL/min (ref >60)
Glucose, Bld: 229 mg/dL — ABNORMAL HIGH (ref 70–99)
Potassium: 3.9 mmol/L (ref 3.5–5.1)
Sodium: 137 mmol/L (ref 135–145)
Total Bilirubin: 0.3 mg/dL (ref 0.3–1.2)
Total Protein: 7.1 g/dL (ref 6.5–8.1)

## 2021-07-27 LAB — CBC WITH DIFFERENTIAL (CANCER CENTER ONLY)
Abs Immature Granulocytes: 0 10*3/uL (ref 0.00–0.07)
Basophils Absolute: 0 10*3/uL (ref 0.0–0.1)
Basophils Relative: 1 %
Eosinophils Absolute: 0.3 10*3/uL (ref 0.0–0.5)
Eosinophils Relative: 6 %
HCT: 42.7 % (ref 39.0–52.0)
Hemoglobin: 14 g/dL (ref 13.0–17.0)
Immature Granulocytes: 0 %
Lymphocytes Relative: 24 %
Lymphs Abs: 1.2 10*3/uL (ref 0.7–4.0)
MCH: 28.2 pg (ref 26.0–34.0)
MCHC: 32.8 g/dL (ref 30.0–36.0)
MCV: 85.9 fL (ref 80.0–100.0)
Monocytes Absolute: 0.6 10*3/uL (ref 0.1–1.0)
Monocytes Relative: 12 %
Neutro Abs: 3 10*3/uL (ref 1.7–7.7)
Neutrophils Relative %: 57 %
Platelet Count: 174 10*3/uL (ref 150–400)
RBC: 4.97 MIL/uL (ref 4.22–5.81)
RDW: 13 % (ref 11.5–15.5)
WBC Count: 5.1 10*3/uL (ref 4.0–10.5)
nRBC: 0 % (ref 0.0–0.2)

## 2021-07-27 LAB — TSH: TSH: 0.367 u[IU]/mL (ref 0.320–4.118)

## 2021-07-27 NOTE — Telephone Encounter (Signed)
Referral called to [facility]. Dr Jyl Heinz for Melanoma with larger axillary lymph node

## 2021-07-27 NOTE — Progress Notes (Signed)
Hematology and Oncology Follow Up Visit  Ryan Chandler [id number] September 11, 1966 55 y.o. 07/27/2021 1:49 PM Ryan Chandler, [name], Ryan Reaper, MD   Principle Diagnosis: 55 year old man with melanoma of unknown primary.  He presented with bulky left axillary lymph node diagnosed in January 2022.     Prior Therapy:   He is status post biopsy with ultrasound guidance completed in January 2022 confirmed the presence of melanoma.  Ipilimumab 1 mg/kg and nivolumab 3 mg/kg neoadjuvant treatment started on January 14, 2021.  He completed 4 cycles of therapy with stable disease   Current therapy: Tafinlar 75m capsules, 2 capsules (1570m by mouth twice daily with  Mekinist 2 mg tablets,1 tablet by mouth once daily started on April 27, 2021.   Interim History: Ryan Chandler today for repeat evaluation.  Since the last visit, he has not resumed BRAF targeted therapy without any complications.  He denies any nausea, vomiting or abdominal pain.  He denies any worsening fever or pruritus.  He denies any increased pain or discomfort in his left axilla.  He continues to work although limited at this time.     Medications: Updated on review. Current Outpatient Medications  Medication Sig Dispense Refill   aspirin 81 MG EC tablet      Blood Glucose Monitoring Suppl (ONE TOUCH ULTRA 2) w/Device KIT      dabrafenib mesylate (TAFINLAR) 75 MG capsule Take 2 capsules (150 mg total) by mouth 2 (two) times daily. Take on an empty stomach 1 hour before or 2 hours after meals. 120 capsule 0   empagliflozin (JARDIANCE) 25 MG TABS tablet Take 25 mg by mouth daily.     ergocalciferol (VITAMIN D2) 1.25 MG (50000 UT) capsule Take 1 capsule by mouth once a week.     insulin glargine, 1 Unit Dial, (TOUJEO SOLOSTAR) 300 UNIT/ML Solostar Pen      lisinopril (PRINIVIL,ZESTRIL) 5 MG tablet Take 5 mg by mouth daily.     metFORMIN (GLUCOPHAGE) 1000 MG tablet Take 1,000 mg by mouth 2 (two) times daily.     ONE TOUCH  CLUB LANCETS MISC      prochlorperazine (COMPAZINE) 10 MG tablet Take 1 tablet (10 mg total) by mouth every 6 (six) hours as needed for nausea or vomiting. 30 tablet 0   simvastatin (ZOCOR) 40 MG tablet Take 40 mg by mouth daily.     trametinib dimethyl sulfoxide (MEKINIST) 2 MG tablet Take 1 tablet (2 mg total) by mouth daily. Take 1 hour before or 2 hours after a meal. Store refrigerated in original container. 30 tablet 0   triamcinolone ointment (KENALOG) 0.5 % Apply 1 application topically 2 (two) times daily. 30 g 0   No current facility-administered medications for this visit.     Allergies: No Known Allergies    Physical Exam:  Blood pressure (!) 141/75, pulse 84, temperature 98.5 F (36.9 C), temperature source Oral, resp. rate 17, height 6' 2"  (1.88 m), weight 194 lb 14.4 oz (88.4 kg), SpO2 99 %.   ECOG: 0    General appearance: Comfortable appearing without any discomfort Head: Normocephalic without any trauma Oropharynx: Mucous membranes are moist and pink without any thrush or ulcers. Eyes: Pupils are equal and round reactive to light. Lymph nodes: Left axillary mass appeared similar in size. Heart:regular rate and rhythm.  S1 and S2 without leg edema. Lung: Clear without any rhonchi or wheezes.  No dullness to percussion. Abdomin: Soft, nontender, nondistended with good bowel sounds.  No hepatosplenomegaly. Musculoskeletal: No  joint deformity or effusion.  Full range of motion noted. Neurological: No deficits noted on motor, sensory and deep tendon reflex exam. Skin: No petechial rash or dryness.  Appeared moist.          Lab Results: Lab Results  Component Value Date   WBC 5.1 07/27/2021   HGB 14.0 07/27/2021   HCT 42.7 07/27/2021   MCV 85.9 07/27/2021   PLT 174 07/27/2021     Chemistry      Component Value Date/Time   NA 136 07/01/2021 1109   K 4.2 07/01/2021 1109   CL 99 07/01/2021 1109   CO2 29 07/01/2021 1109   BUN 9 07/01/2021 1109    CREATININE 0.81 07/01/2021 1109      Component Value Date/Time   CALCIUM 9.2 07/01/2021 1109   ALKPHOS 85 07/01/2021 1109   AST 12 (L) 07/01/2021 1109   ALT 13 07/01/2021 1109   BILITOT 0.3 07/01/2021 1109        Impression and Plan:    55 year old man with:   1.    Melanoma of unknown primary presented in January 2022 with left axillary lymphadenopathy.  CT venogram obtained on 07/08/2021 continues to show bulky left axillary lymphadenopathy that has slightly increased from prior study.  There is mass-effect on the left upper arm with central venous structures are patent.  Management options at this time were reviewed.  His melanoma continues to be resistant to systemic therapy and although clinically not dramatically changed.  I feel that surgical intervention is his best option moving forward although this might carries a higher risk given the potential vascular compromise in his axilla.  I discussed with him a referral to [facility] versus referral to [facility] given that his original work-up was completed there.  He is interested in obtaining opinion at [facility] and we will make the appropriate referral.  For the time being I recommended continued BRAF targeted therapy.  Next generation sequencing would be also helpful on his original biopsy to discern if there is a targetable mutation that could be used in case surgery cannot be completed.   2.  Skin rash: Resolved at this time.   3.  Antiemetics: No nausea or vomiting reported at this time.  Compazine is available to him.   4.  Follow-up: He will return next months for repeat follow-up.  30  minutes were spent on this encounter.  Time was dedicated to reviewing laboratory data, disease status update, reviewing imaging studies and future plan of care discussion.   Zola Button, MD 9/7/20221:49 PM

## 2021-07-27 NOTE — Progress Notes (Signed)
Met with patient at registration to complete grant paperwork.  Patient approved for one-time $1000 [employer] grant to assist with personal expenses while going through treatment. Gave copy of approval letter an expense sheet to him in green folder along with my card for his daughter to explain to him as he presented alone today. He received a gift card from his grant.  He has my card for any additional financial questions or concerns.

## 2021-08-02 ENCOUNTER — Telehealth: Payer: Self-pay | Admitting: *Deleted

## 2021-08-02 NOTE — Telephone Encounter (Signed)
Myra w/ Dr. [name] @ [facility] requested most recent office visit notes/labs/imaging related to recent referral to Dr. Crisoforo Oxford. Records faxed - fax confirmation received

## 2021-08-04 ENCOUNTER — Emergency Department (HOSPITAL_COMMUNITY)
Admission: EM | Admit: 2021-08-04 | Discharge: 2021-08-04 | Disposition: A | Discharge status: Home / Self Care | Payer: Managed Care, Other (non HMO) | Attending: Emergency Medicine | Admitting: Emergency Medicine

## 2021-08-04 ENCOUNTER — Telehealth: Payer: Self-pay | Admitting: *Deleted

## 2021-08-04 ENCOUNTER — Encounter: Payer: Self-pay | Admitting: Oncology

## 2021-08-04 ENCOUNTER — Encounter (HOSPITAL_COMMUNITY): Payer: Self-pay

## 2021-08-04 ENCOUNTER — Emergency Department (HOSPITAL_COMMUNITY): Payer: Managed Care, Other (non HMO)

## 2021-08-04 ENCOUNTER — Other Ambulatory Visit: Payer: Self-pay

## 2021-08-04 DIAGNOSIS — Z79899 Other long term (current) drug therapy: Secondary | ICD-10-CM | POA: Diagnosis not present

## 2021-08-04 DIAGNOSIS — E119 Type 2 diabetes mellitus without complications: Secondary | ICD-10-CM | POA: Insufficient documentation

## 2021-08-04 DIAGNOSIS — Z20822 Contact with and (suspected) exposure to covid-19: Secondary | ICD-10-CM | POA: Insufficient documentation

## 2021-08-04 DIAGNOSIS — R509 Fever, unspecified: Secondary | ICD-10-CM | POA: Insufficient documentation

## 2021-08-04 DIAGNOSIS — Z7982 Long term (current) use of aspirin: Secondary | ICD-10-CM | POA: Diagnosis not present

## 2021-08-04 DIAGNOSIS — Z794 Long term (current) use of insulin: Secondary | ICD-10-CM | POA: Insufficient documentation

## 2021-08-04 DIAGNOSIS — Z87891 Personal history of nicotine dependence: Secondary | ICD-10-CM | POA: Insufficient documentation

## 2021-08-04 DIAGNOSIS — R791 Abnormal coagulation profile: Secondary | ICD-10-CM | POA: Diagnosis not present

## 2021-08-04 DIAGNOSIS — B349 Viral infection, unspecified: Secondary | ICD-10-CM

## 2021-08-04 DIAGNOSIS — Z7984 Long term (current) use of oral hypoglycemic drugs: Secondary | ICD-10-CM | POA: Insufficient documentation

## 2021-08-04 DIAGNOSIS — I10 Essential (primary) hypertension: Secondary | ICD-10-CM | POA: Insufficient documentation

## 2021-08-04 LAB — URINALYSIS, ROUTINE W REFLEX MICROSCOPIC
Bacteria, UA: NONE SEEN
Bilirubin Urine: NEGATIVE
Glucose, UA: >=500 mg/dL — AB
Ketones, ur: 80 mg/dL — AB
Leukocytes,Ua: NEGATIVE
Nitrite: NEGATIVE
Protein, ur: NEGATIVE mg/dL
Specific Gravity, Urine: 1.032 — ABNORMAL HIGH (ref 1.005–1.030)
pH: 5 (ref 5.0–8.0)

## 2021-08-04 LAB — COMPREHENSIVE METABOLIC PANEL
ALT: 27 U/L (ref 0–44)
AST: 43 U/L — ABNORMAL HIGH (ref 15–41)
Albumin: 3.3 g/dL — ABNORMAL LOW (ref 3.5–5.0)
Alkaline Phosphatase: 136 U/L — ABNORMAL HIGH (ref 38–126)
Anion gap: 14 (ref 5–15)
BUN: 10 mg/dL (ref 6–20)
CO2: 20 mmol/L — ABNORMAL LOW (ref 22–32)
Calcium: 9.1 mg/dL (ref 8.9–10.3)
Chloride: 101 mmol/L (ref 98–111)
Creatinine, Ser: 0.79 mg/dL (ref 0.61–1.24)
GFR, Estimated: >60 mL/min (ref >60)
Glucose, Bld: 123 mg/dL — ABNORMAL HIGH (ref 70–99)
Potassium: 4 mmol/L (ref 3.5–5.1)
Sodium: 135 mmol/L (ref 135–145)
Total Bilirubin: 0.9 mg/dL (ref 0.3–1.2)
Total Protein: 8.1 g/dL (ref 6.5–8.1)

## 2021-08-04 LAB — CBC WITH DIFFERENTIAL/PLATELET
Abs Immature Granulocytes: 0.1 10*3/uL — ABNORMAL HIGH (ref 0.00–0.07)
Basophils Absolute: 0 10*3/uL (ref 0.0–0.1)
Basophils Relative: 0 %
Eosinophils Absolute: 0 10*3/uL (ref 0.0–0.5)
Eosinophils Relative: 0 %
HCT: 39.3 % (ref 39.0–52.0)
Hemoglobin: 13 g/dL (ref 13.0–17.0)
Immature Granulocytes: 1 %
Lymphocytes Relative: 10 %
Lymphs Abs: 0.9 10*3/uL (ref 0.7–4.0)
MCH: 28 pg (ref 26.0–34.0)
MCHC: 33.1 g/dL (ref 30.0–36.0)
MCV: 84.7 fL (ref 80.0–100.0)
Monocytes Absolute: 0.9 10*3/uL (ref 0.1–1.0)
Monocytes Relative: 10 %
Neutro Abs: 6.6 10*3/uL (ref 1.7–7.7)
Neutrophils Relative %: 79 %
Platelets: 269 10*3/uL (ref 150–400)
RBC: 4.64 MIL/uL (ref 4.22–5.81)
RDW: 13.4 % (ref 11.5–15.5)
WBC: 8.6 10*3/uL (ref 4.0–10.5)
nRBC: 0 % (ref 0.0–0.2)

## 2021-08-04 LAB — LACTIC ACID, PLASMA: Lactic Acid, Venous: 1 mmol/L (ref 0.5–1.9)

## 2021-08-04 LAB — PROTIME-INR
INR: 1.1 (ref 0.8–1.2)
Prothrombin Time: 13.7 seconds (ref 11.4–15.2)

## 2021-08-04 LAB — RESP PANEL BY RT-PCR (FLU A&B, COVID) ARPGX2
Influenza A by PCR: NEGATIVE
Influenza B by PCR: NEGATIVE
SARS Coronavirus 2 by RT PCR: NEGATIVE

## 2021-08-04 LAB — APTT: aPTT: 32 seconds (ref 24–36)

## 2021-08-04 MED ORDER — SODIUM CHLORIDE 0.9 % IV BOLUS
1000.0000 mL | Freq: Once | INTRAVENOUS | Status: AC
  Administered 2021-08-04: 1000 mL via INTRAVENOUS

## 2021-08-04 NOTE — Discharge Instructions (Signed)
Your COVID test and your lab tests were normal today  Blood cultures will take 24 hours to come back and if it is positive you will be called to come back to the hospital for  Please follow-up with oncology tomorrow at [facility].   Dr. [name] will also follow-up with you  Return to ER if you have persistent fever, chest pain, abdominal pain, vomiting

## 2021-08-04 NOTE — Telephone Encounter (Signed)
PC to patient, informed him that, per E. Oliver Barre, he has $0 copay assistance & his Mekinist is scheduled to be sent to him from [facility].  Patient verbalizes understanding, also states he has had chills this afternoon & his temperature is 102.  Instructed patient to go to ED now, he verbalizes understanding.

## 2021-08-04 NOTE — ED Provider Notes (Signed)
Emergency Medicine Provider Triage Evaluation Note  Ryan Chandler , a 55 y.o. male  was evaluated in triage.  Pt complains of fevers and shaking after taking his medication for the past three to 4 days.  He reports that he has no port.  He is unsure if he is getting chemo.  He states that he had pain with his shaking episodes.   Review of Systems  Positive: Shaking, fever Negative: Vomiting, diarrhea.   Physical Exam  BP 140/74 (BP Location: Right Arm)   Pulse (!) 106   Temp 99.6 F (37.6 C) (Oral)   Resp 17   Ht '6\' 2"'$  (1.88 m)   Wt 88.4 kg   SpO2 98%   BMI 25.02 kg/m  Gen:   Awake, no distress   Resp:  Normal effort  MSK:   Moves extremities without difficulty  Other:  Awake and alert.  Patient is not having difficulty speaking.   Medical Decision Making  Medically screening exam initiated at 3:53 PM.  Appropriate orders placed.  Ryan Chandler was informed that the remainder of the evaluation will be completed by another provider, this initial triage assessment does not replace that evaluation, and the importance of remaining in the ED until their evaluation is complete.  Patient is a 55 year old man who presents today for evaluation of fevers, shaking episodes.  He is an oncology patient, he is unsure if he is getting chemo.  He states his temperature has been up as high as 104 at home.  He denies any vomiting.  He is nauseated at his baseline.  He denies any abdominal pain or chest pain. He does not clearly meet sepsis criteria in traige, will order labs, culture and get patient roomed.    Ryan Chandler 08/04/21 1602    Lacretia Leigh, MD 08/06/21 1146

## 2021-08-04 NOTE — Telephone Encounter (Signed)
Patient called, stated is having trouble getting one of his medications from [facility] [employer]) due to inability to pay & difficulty with paperwork.  Patient does state he has his Tafinlar and is taking as directed.  Patient also states at times he has issues with chills, shaking, weakness - denies fever.  States this issue is intermittent, I asked if he has thermometer at home, which he does, instructed patient to take his temp during these episodes & if he has fever of 100.5 to call MD or go to ED.  Patient verbalizes understanding.  I called [facility] inquiring about issue with patient receiving his [place] from [facility] states that patient has been unable to pay but their records show that he has copay assistance.  VM left for Stefanie Libel to return call about this medication issue.

## 2021-08-04 NOTE — ED Triage Notes (Addendum)
Patient c/o fever, chills and reports that he had a fever of almost 104. Temp in triage-99.6.  Patient denies any other symptoms.  Patient states "I have different reactions to my cancer medications."
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
## 2021-08-05 LAB — URINE CULTURE: Culture: NO GROWTH

## 2021-08-08 ENCOUNTER — Telehealth: Payer: Self-pay | Admitting: *Deleted

## 2021-08-08 NOTE — Telephone Encounter (Signed)
Patient called stating that he called about his medication and the pharmacy isn't showing a $0 co pay.  Routing to Wynn Maudlin for assistance with the patient and his pharmacy.

## 2021-08-09 ENCOUNTER — Telehealth: Payer: Self-pay | Admitting: *Deleted

## 2021-08-09 LAB — CULTURE, BLOOD (SINGLE): Culture: NO GROWTH

## 2021-08-09 NOTE — Telephone Encounter (Signed)
Received PC from patient saying he is out of his Mekinist & he has a problem with [facility].  This RN called [facility], they said his Mekinist is ready to ship, they need a call from the patient authorizing the shipment.  I called the patient & informed him of this, he says that [facility] has informed he has a $0 copay but [facility] is telling him he owes a significant amount of money, & that is why he has not scheduled the shipment of Oliver.  Staff message forwarded to E. Tilley for assistance with this matter.

## 2021-08-16 ENCOUNTER — Telehealth: Payer: Self-pay

## 2021-08-16 NOTE — Telephone Encounter (Signed)
Notified Patient of completion of Disability Forms. Copy mailed to Patient as requested. Request for medical records forwarded to [facility] with signed Release of Information Form.

## 2021-08-24 ENCOUNTER — Other Ambulatory Visit: Payer: Self-pay | Admitting: *Deleted

## 2021-08-24 DIAGNOSIS — C439 Malignant melanoma of skin, unspecified: Secondary | ICD-10-CM

## 2021-08-24 MED ORDER — DABRAFENIB MESYLATE 75 MG PO CAPS
150.0000 mg | ORAL_CAPSULE | Freq: Two times a day (BID) | ORAL | 0 refills | Status: DC
Start: 2021-08-24 — End: 2022-08-08

## 2021-08-26 ENCOUNTER — Other Ambulatory Visit: Payer: Self-pay

## 2021-08-26 ENCOUNTER — Inpatient Hospital Stay: Payer: Managed Care, Other (non HMO)

## 2021-08-26 ENCOUNTER — Inpatient Hospital Stay: Payer: Managed Care, Other (non HMO) | Attending: Oncology | Admitting: Oncology

## 2021-08-26 VITALS — BP 125/81 | HR 88 | Temp 98.7°F | Resp 20 | Wt 185.9 lb

## 2021-08-26 DIAGNOSIS — C439 Malignant melanoma of skin, unspecified: Secondary | ICD-10-CM

## 2021-08-26 DIAGNOSIS — C4359 Malignant melanoma of other part of trunk: Secondary | ICD-10-CM | POA: Insufficient documentation

## 2021-08-26 NOTE — Progress Notes (Signed)
Hematology and Oncology Follow Up Visit  Ryan Chandler [id number] 12-Apr-1966 55 y.o. 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   Unknown primary identified.   Prior Therapy:   He is status post biopsy with ultrasound guidance completed in January 2022 confirmed the presence of melanoma.  Ipilimumab 1 mg/kg and nivolumab 3 mg/kg neoadjuvant treatment started on January 14, 2021.  He completed 4 cycles of therapy with stable disease   He is status post surgical resection and lymph node dissection completed on August 22, 2021 at [facility].  Current therapy: Tafinlar 12m capsules, 2 capsules (1528m by mouth twice daily with  Mekinist 2 mg tablets,1 tablet by mouth once daily started on April 27, 2021.   Interim History: Mr. Ryan Chandler here for a follow-up visit.  Since the last visit, he underwent surgical resection completed on August 22, 2021 [facility].  He tolerated the surgery well without any major complaints.  He is changing dressing regularly with the drain currently in place.  He denies any fevers or chills or sweats.  He denies any other complaints.  He did report a few complaints prior to surgery including rash, fever and fatigue associated with his melanoma treatment.     Medications: Reviewed without changes. Current Outpatient Medications  Medication Sig Dispense Refill   aspirin 81 MG EC tablet      Blood Glucose Monitoring Suppl (ONE TOUCH ULTRA 2) w/Device KIT      dabrafenib mesylate (TAFINLAR) 75 MG capsule Take 2 capsules (150 mg total) by mouth 2 (two) times daily. Take on an empty stomach 1 hour before or 2 hours after meals. 120 capsule 0   empagliflozin (JARDIANCE) 25 MG TABS tablet Take 25 mg by mouth daily.     ergocalciferol (VITAMIN D2)  1.25 MG (50000 UT) capsule Take 1 capsule by mouth once a week.     insulin glargine, 1 Unit Dial, (TOUJEO SOLOSTAR) 300 UNIT/ML Solostar Pen      lisinopril (PRINIVIL,ZESTRIL) 5 MG tablet Take 5 mg by mouth daily.     metFORMIN (GLUCOPHAGE) 1000 MG tablet Take 1,000 mg by mouth 2 (two) times daily.     ONE TOUCH CLUB LANCETS MISC      prochlorperazine (COMPAZINE) 10 MG tablet Take 1 tablet (10 mg total) by mouth every 6 (six) hours as needed for nausea or vomiting. 30 tablet 0   simvastatin (ZOCOR) 40 MG tablet Take 40 mg by mouth daily.     trametinib dimethyl sulfoxide (MEKINIST) 2 MG tablet Take 1 tablet (2 mg total) by mouth daily. Take 1 hour before or 2 hours after a meal. Store refrigerated in original container. 30 tablet 0   triamcinolone ointment (KENALOG) 0.5 % Apply 1 application topically 2 (two) times daily. 30 g 0   No current facility-administered medications for this visit.     Allergies: No Known Allergies    Physical Exam:  Blood pressure 125/81, pulse 88, temperature 98.7 F (37.1 C), resp. rate 20, weight 185 lb 14.4 oz (84.3 kg), SpO2 99 %.   ECOG: 0   General appearance: Alert, awake without any distress. Head: Atraumatic without abnormalities Oropharynx: Without any thrush or ulcers. Eyes: No scleral icterus. Lymph nodes: Scar noted around the left axillary line.  No erythema or induration. Heart:regular rate and  rhythm, without any murmurs or gallops.   Lung: Clear to auscultation without any rhonchi, wheezes or dullness to percussion. Abdomin: Soft, nontender without any shifting dullness or ascites. Musculoskeletal: No clubbing or cyanosis. Neurological: No motor or sensory deficits. Skin: No rashes or lesions.          Lab Results: Lab Results  Component Value Date   WBC 8.6 08/04/2021   HGB 13.0 08/04/2021   HCT 39.3 08/04/2021   MCV 84.7 08/04/2021   PLT 269 08/04/2021     Chemistry      Component Value Date/Time   NA 135  08/04/2021 1600   K 4.0 08/04/2021 1600   CL 101 08/04/2021 1600   CO2 20 (L) 08/04/2021 1600   BUN 10 08/04/2021 1600   CREATININE 0.79 08/04/2021 1600   CREATININE 0.94 07/27/2021 1338      Component Value Date/Time   CALCIUM 9.1 08/04/2021 1600   ALKPHOS 136 (H) 08/04/2021 1600   AST 43 (H) 08/04/2021 1600   AST 11 (L) 07/27/2021 1338   ALT 27 08/04/2021 1600   ALT 9 07/27/2021 1338   BILITOT 0.9 08/04/2021 1600   BILITOT 0.3 07/27/2021 1338        Impression and Plan:    54 year old man with:   1.    Stage III melanoma of unknown primary presented in January 2022 with bulky lymphadenopathy.  He is status surgical resection at this time completed on August 22, 2021.  The final pathology is currently pending and currently has stopped his BRAF targeted therapy.  Management options moving forward were reviewed.  We will await the results of his pathology and likely would benefit from adjuvant radiation therapy to decrease local recurrence.  The role for systemic therapy will be more debatable at this time given his poor response to both immunotherapy and BRAF targeted therapy.  We will evaluate that option in the future.   2.  Skin rash: Related to his oral targeted therapy and has resolved at this time.     3.  Follow-up:   30  minutes were dedicated to this visit.  The time spent on reviewing his disease status, treatment choices and future plan of care discussion.   Zola Button, MD 10/7/20222:38 PM

## 2021-08-31 ENCOUNTER — Encounter: Payer: Self-pay | Admitting: Oncology

## 2021-08-31 ENCOUNTER — Telehealth: Payer: Self-pay | Admitting: *Deleted

## 2021-08-31 NOTE — Telephone Encounter (Signed)
Ryan Chandler is going to send a Pharmacist, community message with all of the information needed for letter.

## 2021-08-31 NOTE — Telephone Encounter (Signed)
Ryan Chandler states he needs a letter to help get his mother into the country so she can help him after surgery.

## 2021-09-01 ENCOUNTER — Encounter: Payer: Self-pay | Admitting: *Deleted

## 2021-09-01 ENCOUNTER — Telehealth: Payer: Self-pay | Admitting: *Deleted

## 2021-09-01 NOTE — Telephone Encounter (Signed)
Patient sent MyChart message with request for letter from Dr. [name]:  I am wanting you to write a letter to the [place] [employer] [place] in order for my mother to be given approval to come to the [place] due to my medical conditions. Her full name is Ryan Chandler. Her DOB is 11/21/1947. If you have any questions regarding this information you can reach me at [phone]. Thank you !  Attempted to contact patient by phone and reached named voice mail.  Informed patient that Dr. [name] will provide a letter.  This is the message sent to MyChart and left on voice mail:  The office needs assistance with the information to include in the letter: ~Can the letter be addressed "to whom it may concern", or does it need to be to a certain person? ~Does it need to have details with information about your condition and treatment? ~Does your address need to be included?  ~Are the dates for your mother's stay needed?  Please let us know what is needed and we will prepare the letter.

## 2021-09-02 ENCOUNTER — Encounter: Payer: Self-pay | Admitting: *Deleted

## 2021-09-22 ENCOUNTER — Other Ambulatory Visit: Payer: Self-pay

## 2021-09-22 ENCOUNTER — Inpatient Hospital Stay: Payer: Managed Care, Other (non HMO) | Attending: Oncology

## 2021-09-22 ENCOUNTER — Inpatient Hospital Stay (HOSPITAL_BASED_OUTPATIENT_CLINIC_OR_DEPARTMENT_OTHER): Payer: Managed Care, Other (non HMO) | Admitting: Oncology

## 2021-09-22 VITALS — BP 114/76 | HR 94 | Temp 97.9°F | Resp 20 | Wt 189.5 lb

## 2021-09-22 DIAGNOSIS — R21 Rash and other nonspecific skin eruption: Secondary | ICD-10-CM | POA: Diagnosis not present

## 2021-09-22 DIAGNOSIS — C439 Malignant melanoma of skin, unspecified: Secondary | ICD-10-CM | POA: Diagnosis not present

## 2021-09-22 DIAGNOSIS — C4359 Malignant melanoma of other part of trunk: Secondary | ICD-10-CM | POA: Diagnosis present

## 2021-09-22 LAB — CMP (CANCER CENTER ONLY)
ALT: 8 U/L (ref 0–44)
AST: 7 U/L — ABNORMAL LOW (ref 15–41)
Albumin: 3.6 g/dL (ref 3.5–5.0)
Alkaline Phosphatase: 76 U/L (ref 38–126)
Anion gap: 13 (ref 5–15)
BUN: 10 mg/dL (ref 6–20)
CO2: 26 mmol/L (ref 22–32)
Calcium: 9.3 mg/dL (ref 8.9–10.3)
Chloride: 101 mmol/L (ref 98–111)
Creatinine: 0.81 mg/dL (ref 0.61–1.24)
GFR, Estimated: >60 mL/min (ref >60)
Glucose, Bld: 243 mg/dL — ABNORMAL HIGH (ref 70–99)
Potassium: 3.7 mmol/L (ref 3.5–5.1)
Sodium: 140 mmol/L (ref 135–145)
Total Bilirubin: 0.2 mg/dL — ABNORMAL LOW (ref 0.3–1.2)
Total Protein: 7.6 g/dL (ref 6.5–8.1)

## 2021-09-22 LAB — CBC WITH DIFFERENTIAL (CANCER CENTER ONLY)
Abs Immature Granulocytes: 0.02 10*3/uL (ref 0.00–0.07)
Basophils Absolute: 0 10*3/uL (ref 0.0–0.1)
Basophils Relative: 1 %
Eosinophils Absolute: 0.5 10*3/uL (ref 0.0–0.5)
Eosinophils Relative: 9 %
HCT: 40.6 % (ref 39.0–52.0)
Hemoglobin: 13.4 g/dL (ref 13.0–17.0)
Immature Granulocytes: 0 %
Lymphocytes Relative: 21 %
Lymphs Abs: 1.3 10*3/uL (ref 0.7–4.0)
MCH: 28.3 pg (ref 26.0–34.0)
MCHC: 33 g/dL (ref 30.0–36.0)
MCV: 85.7 fL (ref 80.0–100.0)
Monocytes Absolute: 0.5 10*3/uL (ref 0.1–1.0)
Monocytes Relative: 8 %
Neutro Abs: 3.8 10*3/uL (ref 1.7–7.7)
Neutrophils Relative %: 61 %
Platelet Count: 263 10*3/uL (ref 150–400)
RBC: 4.74 MIL/uL (ref 4.22–5.81)
RDW: 14.4 % (ref 11.5–15.5)
WBC Count: 6.2 10*3/uL (ref 4.0–10.5)
nRBC: 0 % (ref 0.0–0.2)

## 2021-09-22 NOTE — Progress Notes (Signed)
Hematology and Oncology Follow Up Visit  Ryan Chandler [id number] July 30, 1966 55 y.o. 09/22/2021 3:41 PM [name], [name], [name], MD   Principle Diagnosis: 55 year old man with melanoma of the left axilla diagnosed in January 2022.  He presented with stage III disease without known primary.  Prior Therapy:   He is status post biopsy with ultrasound guidance completed in January 2022 confirmed the presence of melanoma.  Ipilimumab 1 mg/kg and nivolumab 3 mg/kg neoadjuvant treatment started on January 14, 2021.  He completed 4 cycles of therapy with stable disease   He is status post surgical resection and lymph node dissection completed on August 22, 2021 at [facility].  The final pathology showed 1 out of 9 lymph nodes involved with malignancy.  Tafinlar 20m capsules, 2 capsules (1581m by mouth twice daily with  Mekinist 2 mg tablets,1 tablet by mouth once daily started on April 27, 2021.  Therapy discontinued in October 2022.  Current therapy: Active surveillance under consideration for additional therapy.  Interim History: Ryan Chandler here for a follow-up visit.  Since the last visit, he continues to recover from his surgical resection and lymph node dissection at this time.  He had denies any worsening pain, fatigue or tiredness.  He denies any hospitalizations or illnesses.  Denies any fevers or chills or sweats.  He does have some pruritus and currently on Atarax which have helped his symptoms.     Medications: Updated on review. Current Outpatient Medications  Medication Sig Dispense Refill   aspirin 81 MG EC tablet      Blood Glucose Monitoring Suppl (ONE TOUCH ULTRA 2) w/Device KIT      dabrafenib mesylate (TAFINLAR) 75 MG capsule Take 2 capsules (150 mg total) by mouth 2 (two) times daily. Take on an empty stomach 1 hour before or 2 hours after meals. 120 capsule 0   empagliflozin (JARDIANCE) 25 MG TABS tablet Take 25 mg by mouth daily.      ergocalciferol (VITAMIN D2) 1.25 MG (50000 UT) capsule Take 1 capsule by mouth once a week.     insulin glargine, 1 Unit Dial, (TOUJEO SOLOSTAR) 300 UNIT/ML Solostar Pen      lisinopril (PRINIVIL,ZESTRIL) 5 MG tablet Take 5 mg by mouth daily.     metFORMIN (GLUCOPHAGE) 1000 MG tablet Take 1,000 mg by mouth 2 (two) times daily.     ONE TOUCH CLUB LANCETS MISC      prochlorperazine (COMPAZINE) 10 MG tablet Take 1 tablet (10 mg total) by mouth every 6 (six) hours as needed for nausea or vomiting. 30 tablet 0   simvastatin (ZOCOR) 40 MG tablet Take 40 mg by mouth daily.     trametinib dimethyl sulfoxide (MEKINIST) 2 MG tablet Take 1 tablet (2 mg total) by mouth daily. Take 1 hour before or 2 hours after a meal. Store refrigerated in original container. 30 tablet 0   triamcinolone ointment (KENALOG) 0.5 % Apply 1 application topically 2 (two) times daily. 30 g 0   No current facility-administered medications for this visit.     Allergies: No Known Allergies    Physical Exam:  Blood pressure 114/76, pulse 94, temperature 97.9 F (36.6 C), resp. rate 20, weight 189 lb 8 oz (86 kg), SpO2 100 %.   ECOG: 0    General appearance: Comfortable appearing without any discomfort Head: Normocephalic without any trauma Oropharynx: Mucous membranes are moist and pink without any thrush or ulcers. Eyes: Pupils are equal and round reactive to light.  Lymph nodes: No cervical, supraclavicular, inguinal or axillary lymphadenopathy.   Heart:regular rate and rhythm.  S1 and S2 without leg edema. Lung: Clear without any rhonchi or wheezes.  No dullness to percussion. Abdomin: Soft, nontender, nondistended with good bowel sounds.  No hepatosplenomegaly. Musculoskeletal: No joint deformity or effusion.  Full range of motion noted. Neurological: No deficits noted on motor, sensory and deep tendon reflex exam. Skin: No petechial rash or dryness.  Appeared moist.           Lab Results: Lab Results   Component Value Date   WBC 6.2 09/22/2021   HGB 13.4 09/22/2021   HCT 40.6 09/22/2021   MCV 85.7 09/22/2021   PLT 263 09/22/2021     Chemistry      Component Value Date/Time   NA 135 08/04/2021 1600   K 4.0 08/04/2021 1600   CL 101 08/04/2021 1600   CO2 20 (L) 08/04/2021 1600   BUN 10 08/04/2021 1600   CREATININE 0.79 08/04/2021 1600   CREATININE 0.94 07/27/2021 1338      Component Value Date/Time   CALCIUM 9.1 08/04/2021 1600   ALKPHOS 136 (H) 08/04/2021 1600   AST 43 (H) 08/04/2021 1600   AST 11 (L) 07/27/2021 1338   ALT 27 08/04/2021 1600   ALT 9 07/27/2021 1338   BILITOT 0.9 08/04/2021 1600   BILITOT 0.3 07/27/2021 1338      Final Pathologic Diagnosis    A. SKIN WITH TUMOR, EXCISION:               Post surgical scar, no neoplasm was identified.    B. PERCUTANEOUS BIOPSY SITE, EXCISION:               Tumoral Melanosis and Regression.   C. LEFT AXILLARY RADICAL LYMPH NODE LEVEL1,2,3, RESECTION:               Neoplastic cells consistent with metastatic melanoma in one of 9 lymph nodes (1/9).              Extensive tumoral melanosis              Lymphovascular invasion and extracapsular extension are present.               Largest focus of neoplastic cells is 1.3 cm.   D. ADDITIONAL LEVEL 3 LEFT AXILLARY LYMPH NODE, RESECTION:               Two lymph nodes with no neoplastic cells identified in sections examined (0/2).              See comment.    E. ROTOR NODES, RESECTION:               Two lymph nodes with no neoplastic cells identified in sections examined (0/2).              See comment   F. ADDITIONAL PERCUTANEOUS BIOPSY SITE, EXCISION:               Post surgical scar, no neoplasm was identified.   Electronically signed by [name]     Impression and Plan:    55 year old man with:   1.    Advanced melanoma of unknown primary presented with bulky adenopathy in January 2022 indicating stage III without any metastatic disease.  Treatment choices  moving forward were discussed at this time.  The role for additional systemic therapy versus additional radiation therapy were reviewed.  Despite the bulky tumor that was  removed he has only 1 out of 9 lymph nodes involved although extracapsular extension was present.  He did receive close to 6 months of systemic therapy with less than adequate response to immunotherapy and poor tolerance to oral targeted therapy.  He still having impurities which could be consequences of both treatment.  At this time, I have opted to defer any additional systemic therapy for the time being and I will refer him to radiation oncology for evaluation regarding adjuvant therapy.  I will arrange for repeat imaging studies in 3 months for repeat evaluation.  2.  Skin rash and pruritus: Manageable at this time with Atarax.     3.  Follow-up: In 3 months after repeat imaging studies.  30  minutes were spent on this encounter.  The time was dedicated to reviewing laboratory data, disease status update, pathology results and future plan of care review.  Zola Button, MD 11/3/20223:41 PM

## 2021-10-03 ENCOUNTER — Encounter: Payer: Self-pay | Admitting: Oncology

## 2021-10-10 NOTE — Progress Notes (Signed)
Radiation Oncology         [phone]) [phone] ________________________________  Initial Outpatient Consultation  Name: Ryan Chandler MRN: [id number]  Date: 10/11/2021  DOB: [birth year]  CC:[name], MD  [name], MD   REFERRING PHYSICIAN: [name], MD  DIAGNOSIS: C43.59   ICD-10-CM   1. Overlapping malignant melanoma of skin (HCC)  C43.8     2. Melanoma of skin ([place])  C43.9     3. 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  He presented with stage III disease from a presumed left subungual primary.   Cancer Staging  Melanoma of skin ([place]) Staging form: Melanoma of the Skin, AJCC 8th Edition - Clinical: Stage III (cTX, cN3c, cM0) - Signed by [name], MD on 12/30/2020   CHIEF COMPLAINT: Here to discuss management of skin cancer  HISTORY OF PRESENT ILLNESS::Ryan Chandler is a 55 y.o. male who presented for an MRI in December 2021 due to back pain which incidentally showed a large axillary nodal mass consistent with malignancy measuring  7.2 x 4.7 x 7 cm. Accordingly, the patient was evaluated by cardiothoracic surgery ([place]) and underwent biopsies and PET scan.  Biopsy on 12/07/20 of the axillary mass revealed metastatic melanoma.   PET on 12/04/20 showed a left axillary nodal mass compatible with nodal metastasis of lymphoma.  No evidence of distant metastasis noted.  The patient was then referred to Dr. [name] on 12/30/20 for evaluation regarding preoperative systemic treatment. During this visit, the patient was noted to feel reasonably well without any complaints. He denied any skin rashes or lesions. Following discussion of treatment options, Dr. [name] recommended proceeding with aggressive combination immunotherapy (given his age and excellent performance status) consisting of Ipilimumab and nivolumab starting on 01/14/2021.   The patient  followed up with Dr. [name] on 02/04/21. During which time, the patient was noted to tolerate therapy well without any complaints other than occasional pruritus.   CT of the chest abdomen and pelvis on 04/15/21 demonstrated the dominant mass in the left axilla as unchanged in size, measuring 6.3 x 4.8 cm. An adjacent mass or lymph node was however seen to have increased in size, measuring 3.4 x 2.5 cm, previously 2.0 x 1.4 cm. Findings were overall noted to be consistent with worsened nodal metastasis of melanoma. No evidence of metastatic disease was otherwise appreciated within the abdomen or pelvis.   Following 4 cycles of Ipilimumab and nivolumab,  the patient began BRAF targeted therapy consisting of Tafinlar and Mekinist starting on 04/27/21. During follow-up with Dr. [name] on 05/19/32, the patient reported increased fatigue and tiredness but no nausea, vomiting or abdominal pain. During follow-up with Dr. [name] on 07/01/21, the patient ran out of his medication and refused to refill it. The patient did develop a skin rash after ceasing treatment, for which he used a topical steroid with some improvement.   CT venogram performed on 07/08/21 demonstrated continued bulky left axillary lymphadenopathy to have slightly increased from prior study.  Also seen was a mass-effect on the left upper arm with central venous structures patent.  During follow-up with Dr. [name] on 07/27/21, the patient's melanoma was noted to show continued resistance to systemic therapy (though clinically not dramatically changed). Given this, Dr. [name] recommended the patient to consider surgical interventions moving foraward. Until that time, the patient was recommended to resume BRAF therapy.  On 08/04/21, the patient presented to the [facility] ED with a history of fever for 2-3 days (measured as 103 at home). Upon evaluation in the ED, the patient had a low grade fever of 99. The patient expressed concern that he was  neutropenic. Given this, blood cultures and CBC were performed which revealed the patient as non-neutropenic. CXR performed was negative.   The patient then met for his initial consultation with Dr. [name], general surgery, on 08/05/21. During which time, the patient opted to proceed with Dr. [name] plan of radical lymphadenectomy of the left axilla. Pathology from the procedure performed on 08/22/21 revealed 1/14 lymph nodes positive for malignancy.  Lymphovascular invasion and extracapsular extension are present. Largest focus of neoplastic cells is 1.3 cm.  The patient is now currently on active surveillance post-procedure.  Due to suboptimal response /tolerance to systemic therapy the patient was referred to me for adjuvant radiation for local regional control   PREVIOUS RADIATION THERAPY: No  PAST MEDICAL HISTORY:  has a past medical history of Back pain, Diabetes mellitus without complication ([place]), Headache, Hyperlipidemia, Hypertension, melanoma (10/2020), and Neck pain.    PAST SURGICAL HISTORY: Past Surgical History:  Procedure Laterality Date   no past surgery      FAMILY HISTORY: family history includes Diabetes in his mother; Parkinson's disease in his father.  SOCIAL HISTORY:  reports that he has quit smoking. He has quit using smokeless tobacco. He reports that he does not drink alcohol and does not use drugs.  ALLERGIES: Patient has no known allergies.  MEDICATIONS:  Current Outpatient Medications  Medication Sig Dispense Refill   aspirin 81 MG EC tablet      Blood Glucose Monitoring Suppl (ONE TOUCH ULTRA 2) w/Device KIT      dabrafenib mesylate (TAFINLAR) 75 MG capsule Take 2 capsules (150 mg total) by mouth 2 (two) times daily. Take on an empty stomach 1 hour before or 2 hours after meals. 120 capsule 0   empagliflozin (JARDIANCE) 25 MG TABS tablet Take 25 mg by mouth daily.     ergocalciferol (VITAMIN D2) 1.25 MG (50000 UT) capsule Take 1 capsule by  mouth once a week.     HYDROcodone-acetaminophen (NORCO/VICODIN) 5-325 MG tablet Take 1 tablet by mouth every 6 (six) hours as needed.     hydrOXYzine (ATARAX/VISTARIL) 50 MG tablet Take 50 mg by mouth 3 (three) times daily as needed.     insulin glargine, 1 Unit Dial, (TOUJEO SOLOSTAR) 300 UNIT/ML Solostar Pen      lisinopril (PRINIVIL,ZESTRIL) 5 MG tablet Take 5 mg by mouth daily.     metFORMIN (GLUCOPHAGE) 1000 MG tablet Take 1,000 mg by mouth 2 (two) times daily.     ONE TOUCH CLUB LANCETS MISC      prochlorperazine (COMPAZINE) 10 MG tablet Take 1 tablet (10 mg total) by mouth every 6 (six) hours as needed for nausea or vomiting. 30 tablet 0   simvastatin (ZOCOR) 40 MG tablet Take 40 mg by mouth daily.     trametinib dimethyl sulfoxide (MEKINIST) 2 MG tablet Take 1 tablet (2 mg total) by mouth daily. Take 1 hour before or 2 hours after a meal. Store refrigerated in original container. 30 tablet 0   triamcinolone ointment (KENALOG) 0.5 % Apply 1 application topically 2 (two) times daily. 30 g 0   No current facility-administered medications for this encounter.    REVIEW OF SYSTEMS:  Notable for that above.   PHYSICAL EXAM:  height is 6'  2" (1.88 m) and weight is 191 lb 6 oz (86.8 kg). His temporal temperature is 96.5 F (35.8 C) (abnormal). His blood pressure is 134/86 and his pulse is 81. His respiration is 18 and oxygen saturation is 100%.   General: Alert and oriented, in no acute distress   HEENT: Head is normocephalic.  Neck: Neck is supple, no palpable cervical or supraclavicular lymphadenopathy. Heart: Regular in rate and rhythm with no murmurs, rubs, or gallops. Chest: Clear to auscultation bilaterally, with no rhonchi, wheezes, or rales. Abdomen: Soft, nontender, nondistended, with no rigidity or guarding. Lymphatics: see Neck Exam; no adenopathy appreciated in the left axilla.  He has postoperative changes adjacent to the left axillary scar.  He does have some mild lymphedema in  the left breast and left axilla Neurologic: Cranial nerves II through XII are grossly intact. No obvious focalities. Speech is fluent. Coordination is intact. Psychiatric: Judgment and insight are intact. Affect is appropriate.   ECOG = 0  0 - Asymptomatic (Fully active, able to carry on all predisease activities without restriction)  1 - Symptomatic but completely ambulatory (Restricted in physically strenuous activity but ambulatory and able to carry out work of a light or sedentary nature. For example, light housework, office work)  2 - Symptomatic, <50% in bed during the day (Ambulatory and capable of all self care but unable to carry out any work activities. Up and about more than 50% of waking hours)  3 - Symptomatic, >50% in bed, but not bedbound (Capable of only limited self-care, confined to bed or chair 50% or more of waking hours)  4 - Bedbound (Completely disabled. Cannot carry on any self-care. Totally confined to bed or chair)  5 - Death   Eustace Pen MM, Creech RH, Tormey DC, et al. [phone]). "Toxicity and response criteria of the [facility]". [place] Oncol. 5 (6): 649-55   LABORATORY DATA:  Lab Results  Component Value Date   WBC 6.2 09/22/2021   HGB 13.4 09/22/2021   HCT 40.6 09/22/2021   MCV 85.7 09/22/2021   PLT 263 09/22/2021   CMP     Component Value Date/Time   NA 140 09/22/2021 1505   K 3.7 09/22/2021 1505   CL 101 09/22/2021 1505   CO2 26 09/22/2021 1505   GLUCOSE 243 (H) 09/22/2021 1505   BUN 10 09/22/2021 1505   CREATININE 0.81 09/22/2021 1505   CALCIUM 9.3 09/22/2021 1505   PROT 7.6 09/22/2021 1505   ALBUMIN 3.6 09/22/2021 1505   AST 7 (L) 09/22/2021 1505   ALT 8 09/22/2021 1505   ALKPHOS 76 09/22/2021 1505   BILITOT 0.2 (L) 09/22/2021 1505   GFRNONAA >60 09/22/2021 1505        RADIOGRAPHY: As above, I personally reviewed his images    IMPRESSION/PLAN:Today, I talked to the patient about the findings and work-up thus  far.  We discussed the patient's diagnosis of melanoma metastatic to the left axilla and general treatment for this, highlighting the role of radiotherapy in the management.  We discussed the available radiation techniques, and focused on the details of logistics and delivery.     Given the patient's difficulty with systemic therapy, I think it is reasonable for him to receive adjuvant radiation for regional control.  Although only 1 lymph node that was resected in October showed melanoma, there was extracapsular extension.  This is a risk factor for regional recurrence.  The patient understands that his life expectancy will not improve with  radiation, but this may spare him from a relapse in the regional nodes.  He would like to be aggressive with his care and he is eager to proceed.  I recommend 48 Gray in 20 fractions which will take 4 weeks to complete.  We discussed the risks, benefits, and side effects of radiotherapy. Side effects may include but not necessarily be limited to: Skin irritation, hair loss, fatigue, nerve injury, rib injury, lung injury; injury to any of the tissues in the irradiated field; no guarantees of treatment were given. A consent form was signed and placed in the patient's medical record.  Patient has mild lymphedema postoperatively and I will continue to monitor this and consider physical therapy referral if it worsens.  The patient was encouraged to ask questions that I answered to the best of my ability.  We will proceed with CT simulation today and start his treatment next week.    On date of service, in total, I spent 50 minutes on this encounter. Patient was seen in person.   __________________________________________   Eppie Gibson, MD  This document serves as a record of services personally performed by Eppie Gibson, MD. It was created on her behalf by Roney Mans, a trained medical scribe. The creation of this record is based on the scribe's personal  observations and the provider's statements to them. This document has been checked and approved by the attending provider.

## 2021-10-10 NOTE — Progress Notes (Signed)
Histology and Location of Primary Skin Cancer:  Advanced melanoma of unknown primary presented with bulky adenopathy in January 2022 indicating stage III without any metastatic disease  Ryan Chandler presented with the following signs/symptoms: shoulder pain in December 2021.  At that time he had an MRI to evaluate that and found to have a large mass in the axilla that is consistent with a malignancy.  The mass measuring 7.2 x 4.7 x 7 cm.  Based on these findings, he was evaluated at [facility] cardi[facility] and underwent biopsy and a PET scan.  Ultrasound-guided biopsy obtained on December 07, 2020 with the results showing metastatic melanoma.  PET CT scan obtained on December 14, 2020 showed a left axillary nodal mass compatible with nodal metastasis of lymphoma.  No evidence of distant metastasis noted.  He was evaluated by Dr. Brantley Stage and was referred to me (Dr. [name]) for evaluation regarding preoperative systemic treatment  Biopsies revealed:  08/22/2021 --Diagnosis   A. SKIN WITH TUMOR, EXCISION:               Post surgical scar, no neoplasm was identified.  B. PERCUTANEOUS BIOPSY SITE, EXCISION:               Tumoral Melanosis and Regression. C. LEFT AXILLARY RADICAL LYMPH NODE LEVEL1,2,3, RESECTION:               Neoplastic cells consistent with metastatic melanoma in one of 9 lymph nodes (1/9).              Extensive tumoral melanosis              Lymphovascular invasion and extracapsular extension are present.               Largest focus of neoplastic cells is 1.3 cm. D. ADDITIONAL LEVEL 3 LEFT AXILLARY LYMPH NODE, RESECTION:               Two lymph nodes with no neoplastic cells identified in sections examined (0/2).              See comment.  E. ROTOR NODES, RESECTION:               Two lymph nodes with no neoplastic cells identified in sections examined (0/2).              See comment F. ADDITIONAL PERCUTANEOUS BIOPSY SITE, EXCISION:                Post surgical scar, no neoplasm was identified.   Past/Anticipated interventions by patient's surgeon/dermatologist for current problematic lesion, if any:  08/22/2021 Dr. Baruch Gouty [place] ([facility]) --PROCEDURES: Radical left axillary lymphadenectomy levels 1, 2 and 3. Lysis of thoracodorsal nerve. Lysis of long thoracic nerve. Lateral tissue mobilization, 13 x 3 cm.  Past/Anticipated interventions by medical oncology, if any:  Under care of Dr. Zola Button 09/22/2021 --Current therapy: Active surveillance under consideration for additional therapy. At this time, I have opted to defer any additional systemic therapy for the time being and I will refer him to radiation oncology for evaluation regarding adjuvant therapy. --Prior Therapy:  He is status post biopsy with ultrasound guidance completed in January 2022 confirmed the presence of melanoma. Ipilimumab 1 mg/kg and nivolumab 3 mg/kg neoadjuvant treatment started on January 14, 2021.  He completed 4 cycles of therapy with stable disease  He is status post surgical resection and lymph node dissection completed on August 22, 2021 at [facility].  The final pathology showed 1 out of 9 lymph nodes involved with malignancy. Tafinlar 75mg  capsules, 2 capsules (150mg ) by mouth twice daily with  Mekinist 2 mg tablets,1 tablet by mouth once daily started on April 27, 2021.  Therapy discontinued in October 2022. --Follow-up: In 3 months after repeat imaging studies.  SAFETY ISSUES: Prior radiation? No Pacemaker/ICD? No Possible current pregnancy? N/A Is the patient on methotrexate? No  Current Complaints / other details:  Reports residual numbness to underside of left upper-arm, as well as swelling to left axilla.

## 2021-10-11 ENCOUNTER — Ambulatory Visit
Admission: RE | Admit: 2021-10-11 | Discharge: 2021-10-11 | Disposition: A | Discharge status: Home / Self Care | Payer: Managed Care, Other (non HMO) | Source: Ambulatory Visit | Attending: Radiation Oncology | Admitting: Radiation Oncology

## 2021-10-11 ENCOUNTER — Other Ambulatory Visit: Payer: Self-pay

## 2021-10-11 ENCOUNTER — Encounter: Payer: Self-pay | Admitting: Radiation Oncology

## 2021-10-11 VITALS — BP 134/86 | HR 81 | Temp 96.5°F | Resp 18 | Ht 74.0 in | Wt 191.4 lb

## 2021-10-11 DIAGNOSIS — C439 Malignant melanoma of skin, unspecified: Secondary | ICD-10-CM

## 2021-10-11 DIAGNOSIS — C4359 Malignant melanoma of other part of trunk: Secondary | ICD-10-CM

## 2021-10-11 DIAGNOSIS — C438 Malignant melanoma of overlapping sites of skin: Secondary | ICD-10-CM

## 2021-10-17 ENCOUNTER — Telehealth: Payer: Self-pay

## 2021-10-17 NOTE — Telephone Encounter (Signed)
Notified Patient of completion of Cancer Information Form. Fax transmission confirmation received. Request for medical records forwarded to [employer] with signed Release of Information Form. Copy of form mailed to Patient as requested

## 2021-10-19 DIAGNOSIS — C4359 Malignant melanoma of other part of trunk: Secondary | ICD-10-CM | POA: Diagnosis present

## 2021-10-20 ENCOUNTER — Ambulatory Visit
Admission: RE | Admit: 2021-10-20 | Discharge: 2021-10-20 | Disposition: A | Discharge status: Home / Self Care | Payer: Managed Care, Other (non HMO) | Source: Ambulatory Visit | Attending: Radiation Oncology | Admitting: Radiation Oncology

## 2021-10-20 ENCOUNTER — Ambulatory Visit: Payer: Managed Care, Other (non HMO) | Admitting: Radiation Oncology

## 2021-10-20 DIAGNOSIS — C4359 Malignant melanoma of other part of trunk: Secondary | ICD-10-CM | POA: Insufficient documentation

## 2021-10-20 DIAGNOSIS — R293 Abnormal posture: Secondary | ICD-10-CM | POA: Diagnosis present

## 2021-10-20 DIAGNOSIS — M25612 Stiffness of left shoulder, not elsewhere classified: Secondary | ICD-10-CM | POA: Diagnosis present

## 2021-10-20 DIAGNOSIS — I890 Lymphedema, not elsewhere classified: Secondary | ICD-10-CM | POA: Diagnosis present

## 2021-10-20 DIAGNOSIS — R590 Localized enlarged lymph nodes: Secondary | ICD-10-CM | POA: Insufficient documentation

## 2021-10-21 ENCOUNTER — Ambulatory Visit: Payer: Managed Care, Other (non HMO)

## 2021-10-21 ENCOUNTER — Ambulatory Visit
Admission: RE | Admit: 2021-10-21 | Discharge: 2021-10-21 | Disposition: A | Discharge status: Home / Self Care | Payer: Managed Care, Other (non HMO) | Source: Ambulatory Visit | Attending: Radiation Oncology | Admitting: Radiation Oncology

## 2021-10-21 ENCOUNTER — Other Ambulatory Visit: Payer: Self-pay

## 2021-10-21 DIAGNOSIS — R590 Localized enlarged lymph nodes: Secondary | ICD-10-CM | POA: Diagnosis not present

## 2021-10-24 ENCOUNTER — Ambulatory Visit: Payer: Managed Care, Other (non HMO)

## 2021-10-24 ENCOUNTER — Ambulatory Visit
Admission: RE | Admit: 2021-10-24 | Discharge: 2021-10-24 | Disposition: A | Discharge status: Home / Self Care | Payer: Managed Care, Other (non HMO) | Source: Ambulatory Visit | Attending: Radiation Oncology | Admitting: Radiation Oncology

## 2021-10-24 ENCOUNTER — Other Ambulatory Visit: Payer: Self-pay

## 2021-10-24 DIAGNOSIS — C4359 Malignant melanoma of other part of trunk: Secondary | ICD-10-CM

## 2021-10-24 DIAGNOSIS — R590 Localized enlarged lymph nodes: Secondary | ICD-10-CM | POA: Diagnosis not present

## 2021-10-24 MED ORDER — SONAFINE EX EMUL
1.0000 "application " | Freq: Two times a day (BID) | CUTANEOUS | Status: DC
  Administered 2021-10-24: 1 via TOPICAL

## 2021-10-24 NOTE — Progress Notes (Signed)
Pt here for patient teaching.    Pt given Radiation and You booklet, skin care instructions, and Sonafine.    Reviewed areas of pertinence such as fatigue, hair loss, and skin changes .   Pt able to give teach back of to pat skin, use unscented/gentle soap, and drink plenty of water,apply Sonafine bid and avoid applying anything to skin within 4 hours of treatment.   Pt demonstrated understanding and verbalizes understanding of information given and will contact nursing with any questions or concerns.    [email or web address]

## 2021-10-25 ENCOUNTER — Ambulatory Visit: Payer: Managed Care, Other (non HMO)

## 2021-10-25 ENCOUNTER — Ambulatory Visit
Admission: RE | Admit: 2021-10-25 | Discharge: 2021-10-25 | Disposition: A | Discharge status: Home / Self Care | Payer: Managed Care, Other (non HMO) | Source: Ambulatory Visit | Attending: Radiation Oncology | Admitting: Radiation Oncology

## 2021-10-25 DIAGNOSIS — R590 Localized enlarged lymph nodes: Secondary | ICD-10-CM | POA: Diagnosis not present

## 2021-10-26 ENCOUNTER — Other Ambulatory Visit: Payer: Self-pay

## 2021-10-26 ENCOUNTER — Ambulatory Visit: Payer: Managed Care, Other (non HMO)

## 2021-10-26 ENCOUNTER — Ambulatory Visit
Admission: RE | Admit: 2021-10-26 | Discharge: 2021-10-26 | Disposition: A | Discharge status: Home / Self Care | Payer: Managed Care, Other (non HMO) | Source: Ambulatory Visit | Attending: Radiation Oncology | Admitting: Radiation Oncology

## 2021-10-26 DIAGNOSIS — R590 Localized enlarged lymph nodes: Secondary | ICD-10-CM | POA: Diagnosis not present

## 2021-10-27 ENCOUNTER — Ambulatory Visit
Admission: RE | Admit: 2021-10-27 | Discharge: 2021-10-27 | Disposition: A | Discharge status: Home / Self Care | Payer: Managed Care, Other (non HMO) | Source: Ambulatory Visit | Attending: Radiation Oncology | Admitting: Radiation Oncology

## 2021-10-27 ENCOUNTER — Ambulatory Visit: Payer: Managed Care, Other (non HMO)

## 2021-10-27 DIAGNOSIS — R590 Localized enlarged lymph nodes: Secondary | ICD-10-CM | POA: Diagnosis not present

## 2021-10-28 ENCOUNTER — Ambulatory Visit: Payer: Managed Care, Other (non HMO)

## 2021-10-28 ENCOUNTER — Ambulatory Visit
Admission: RE | Admit: 2021-10-28 | Discharge: 2021-10-28 | Disposition: A | Discharge status: Home / Self Care | Payer: Managed Care, Other (non HMO) | Source: Ambulatory Visit | Attending: Radiation Oncology | Admitting: Radiation Oncology

## 2021-10-28 ENCOUNTER — Other Ambulatory Visit: Payer: Self-pay

## 2021-10-28 DIAGNOSIS — R590 Localized enlarged lymph nodes: Secondary | ICD-10-CM | POA: Diagnosis not present

## 2021-10-31 ENCOUNTER — Ambulatory Visit
Admission: RE | Admit: 2021-10-31 | Discharge: 2021-10-31 | Disposition: A | Discharge status: Home / Self Care | Payer: Managed Care, Other (non HMO) | Source: Ambulatory Visit | Attending: Radiation Oncology | Admitting: Radiation Oncology

## 2021-10-31 ENCOUNTER — Ambulatory Visit: Payer: Managed Care, Other (non HMO)

## 2021-10-31 ENCOUNTER — Other Ambulatory Visit: Payer: Self-pay

## 2021-10-31 DIAGNOSIS — R590 Localized enlarged lymph nodes: Secondary | ICD-10-CM | POA: Diagnosis not present

## 2021-11-01 ENCOUNTER — Ambulatory Visit
Admission: RE | Admit: 2021-11-01 | Discharge: 2021-11-01 | Disposition: A | Discharge status: Home / Self Care | Payer: Managed Care, Other (non HMO) | Source: Ambulatory Visit | Attending: Radiation Oncology | Admitting: Radiation Oncology

## 2021-11-01 ENCOUNTER — Ambulatory Visit: Payer: Managed Care, Other (non HMO)

## 2021-11-01 ENCOUNTER — Other Ambulatory Visit: Payer: Self-pay

## 2021-11-01 DIAGNOSIS — R590 Localized enlarged lymph nodes: Secondary | ICD-10-CM | POA: Diagnosis not present

## 2021-11-02 ENCOUNTER — Ambulatory Visit
Admission: RE | Admit: 2021-11-02 | Discharge: 2021-11-02 | Disposition: A | Discharge status: Home / Self Care | Payer: Managed Care, Other (non HMO) | Source: Ambulatory Visit | Attending: Radiation Oncology | Admitting: Radiation Oncology

## 2021-11-02 ENCOUNTER — Ambulatory Visit: Payer: Managed Care, Other (non HMO)

## 2021-11-02 DIAGNOSIS — R590 Localized enlarged lymph nodes: Secondary | ICD-10-CM | POA: Diagnosis not present

## 2021-11-03 ENCOUNTER — Other Ambulatory Visit: Payer: Self-pay

## 2021-11-03 ENCOUNTER — Ambulatory Visit: Payer: Managed Care, Other (non HMO)

## 2021-11-03 ENCOUNTER — Ambulatory Visit: Payer: Managed Care, Other (non HMO) | Attending: Radiation Oncology

## 2021-11-03 ENCOUNTER — Ambulatory Visit
Admission: RE | Admit: 2021-11-03 | Discharge: 2021-11-03 | Disposition: A | Discharge status: Home / Self Care | Payer: Managed Care, Other (non HMO) | Source: Ambulatory Visit | Attending: Radiation Oncology | Admitting: Radiation Oncology

## 2021-11-03 DIAGNOSIS — M25612 Stiffness of left shoulder, not elsewhere classified: Secondary | ICD-10-CM | POA: Insufficient documentation

## 2021-11-03 DIAGNOSIS — C4359 Malignant melanoma of other part of trunk: Secondary | ICD-10-CM | POA: Insufficient documentation

## 2021-11-03 DIAGNOSIS — I890 Lymphedema, not elsewhere classified: Secondary | ICD-10-CM | POA: Insufficient documentation

## 2021-11-03 DIAGNOSIS — R590 Localized enlarged lymph nodes: Secondary | ICD-10-CM | POA: Diagnosis not present

## 2021-11-03 DIAGNOSIS — R293 Abnormal posture: Secondary | ICD-10-CM | POA: Insufficient documentation
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
## 2021-11-03 NOTE — Patient Instructions (Signed)
Patient was instructed today in a home exercise program today for post op shoulder range of motion. These included active assist shoulder flexion in supine, scapular retraction, wall walking with shoulder abduction, and hands behind head external rotation in supine.  He was encouraged to do these twice a day, holding 5 seconds and repeating 5imes
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
## 2021-11-04 ENCOUNTER — Ambulatory Visit: Payer: Managed Care, Other (non HMO)

## 2021-11-04 ENCOUNTER — Ambulatory Visit
Admission: RE | Admit: 2021-11-04 | Discharge: 2021-11-04 | Disposition: A | Discharge status: Home / Self Care | Payer: Managed Care, Other (non HMO) | Source: Ambulatory Visit | Attending: Radiation Oncology | Admitting: Radiation Oncology

## 2021-11-04 DIAGNOSIS — R590 Localized enlarged lymph nodes: Secondary | ICD-10-CM | POA: Diagnosis not present

## 2021-11-07 ENCOUNTER — Other Ambulatory Visit: Payer: Self-pay

## 2021-11-07 ENCOUNTER — Ambulatory Visit
Admission: RE | Admit: 2021-11-07 | Discharge: 2021-11-07 | Disposition: A | Discharge status: Home / Self Care | Payer: Managed Care, Other (non HMO) | Source: Ambulatory Visit | Attending: Radiation Oncology | Admitting: Radiation Oncology

## 2021-11-07 ENCOUNTER — Ambulatory Visit: Payer: Managed Care, Other (non HMO)

## 2021-11-07 DIAGNOSIS — R590 Localized enlarged lymph nodes: Secondary | ICD-10-CM | POA: Diagnosis not present

## 2021-11-08 ENCOUNTER — Ambulatory Visit
Admission: RE | Admit: 2021-11-08 | Discharge: 2021-11-08 | Disposition: A | Discharge status: Home / Self Care | Payer: Managed Care, Other (non HMO) | Source: Ambulatory Visit | Attending: Radiation Oncology | Admitting: Radiation Oncology

## 2021-11-08 ENCOUNTER — Ambulatory Visit: Payer: Managed Care, Other (non HMO)

## 2021-11-08 DIAGNOSIS — R590 Localized enlarged lymph nodes: Secondary | ICD-10-CM | POA: Diagnosis not present

## 2021-11-09 ENCOUNTER — Ambulatory Visit: Payer: Managed Care, Other (non HMO)

## 2021-11-09 ENCOUNTER — Other Ambulatory Visit: Payer: Self-pay

## 2021-11-09 ENCOUNTER — Ambulatory Visit
Admission: RE | Admit: 2021-11-09 | Discharge: 2021-11-09 | Disposition: A | Discharge status: Home / Self Care | Payer: Managed Care, Other (non HMO) | Source: Ambulatory Visit | Attending: Radiation Oncology | Admitting: Radiation Oncology

## 2021-11-09 DIAGNOSIS — R590 Localized enlarged lymph nodes: Secondary | ICD-10-CM | POA: Diagnosis not present

## 2021-11-10 ENCOUNTER — Ambulatory Visit: Payer: Managed Care, Other (non HMO)

## 2021-11-10 ENCOUNTER — Ambulatory Visit
Admission: RE | Admit: 2021-11-10 | Discharge: 2021-11-10 | Disposition: A | Discharge status: Home / Self Care | Payer: Managed Care, Other (non HMO) | Source: Ambulatory Visit | Attending: Radiation Oncology | Admitting: Radiation Oncology

## 2021-11-10 DIAGNOSIS — R590 Localized enlarged lymph nodes: Secondary | ICD-10-CM | POA: Diagnosis not present

## 2021-11-11 ENCOUNTER — Ambulatory Visit
Admission: RE | Admit: 2021-11-11 | Discharge: 2021-11-11 | Disposition: A | Discharge status: Home / Self Care | Payer: Managed Care, Other (non HMO) | Source: Ambulatory Visit | Attending: Radiation Oncology | Admitting: Radiation Oncology

## 2021-11-11 ENCOUNTER — Ambulatory Visit: Payer: Managed Care, Other (non HMO)

## 2021-11-11 ENCOUNTER — Other Ambulatory Visit: Payer: Self-pay

## 2021-11-11 DIAGNOSIS — R590 Localized enlarged lymph nodes: Secondary | ICD-10-CM | POA: Diagnosis not present

## 2021-11-15 ENCOUNTER — Ambulatory Visit
Admission: RE | Admit: 2021-11-15 | Discharge: 2021-11-15 | Disposition: A | Discharge status: Home / Self Care | Payer: Managed Care, Other (non HMO) | Source: Ambulatory Visit | Attending: Radiation Oncology | Admitting: Radiation Oncology

## 2021-11-15 ENCOUNTER — Ambulatory Visit: Payer: Managed Care, Other (non HMO)

## 2021-11-15 ENCOUNTER — Other Ambulatory Visit: Payer: Self-pay

## 2021-11-15 DIAGNOSIS — R590 Localized enlarged lymph nodes: Secondary | ICD-10-CM | POA: Diagnosis not present

## 2021-11-16 ENCOUNTER — Ambulatory Visit: Payer: Managed Care, Other (non HMO)

## 2021-11-16 ENCOUNTER — Ambulatory Visit
Admission: RE | Admit: 2021-11-16 | Discharge: 2021-11-16 | Disposition: A | Discharge status: Home / Self Care | Payer: Managed Care, Other (non HMO) | Source: Ambulatory Visit | Attending: Radiation Oncology | Admitting: Radiation Oncology

## 2021-11-16 DIAGNOSIS — R590 Localized enlarged lymph nodes: Secondary | ICD-10-CM | POA: Diagnosis not present

## 2021-11-17 ENCOUNTER — Other Ambulatory Visit: Payer: Self-pay

## 2021-11-17 ENCOUNTER — Ambulatory Visit
Admission: RE | Admit: 2021-11-17 | Discharge: 2021-11-17 | Disposition: A | Discharge status: Home / Self Care | Payer: Managed Care, Other (non HMO) | Source: Ambulatory Visit | Attending: Radiation Oncology | Admitting: Radiation Oncology

## 2021-11-17 ENCOUNTER — Ambulatory Visit: Payer: Managed Care, Other (non HMO)

## 2021-11-17 ENCOUNTER — Encounter: Payer: Self-pay | Admitting: Radiation Oncology

## 2021-11-17 DIAGNOSIS — R590 Localized enlarged lymph nodes: Secondary | ICD-10-CM | POA: Diagnosis not present

## 2021-11-21 ENCOUNTER — Encounter: Payer: Self-pay | Admitting: Oncology

## 2021-11-28 ENCOUNTER — Encounter: Payer: Self-pay | Admitting: Oncology

## 2021-11-28 ENCOUNTER — Other Ambulatory Visit: Payer: Self-pay

## 2021-11-28 ENCOUNTER — Ambulatory Visit: Payer: 59 | Attending: Radiation Oncology

## 2021-11-28 DIAGNOSIS — C4359 Malignant melanoma of other part of trunk: Secondary | ICD-10-CM | POA: Diagnosis present

## 2021-11-28 DIAGNOSIS — R293 Abnormal posture: Secondary | ICD-10-CM | POA: Insufficient documentation

## 2021-11-28 DIAGNOSIS — M25612 Stiffness of left shoulder, not elsewhere classified: Secondary | ICD-10-CM | POA: Diagnosis present

## 2021-11-28 DIAGNOSIS — R590 Localized enlarged lymph nodes: Secondary | ICD-10-CM | POA: Diagnosis not present

## 2021-11-28 DIAGNOSIS — I890 Lymphedema, not elsewhere classified: Secondary | ICD-10-CM | POA: Insufficient documentation
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
## 2021-11-30 ENCOUNTER — Other Ambulatory Visit: Payer: Self-pay

## 2021-11-30 ENCOUNTER — Ambulatory Visit: Payer: 59

## 2021-11-30 DIAGNOSIS — M25612 Stiffness of left shoulder, not elsewhere classified: Secondary | ICD-10-CM

## 2021-11-30 DIAGNOSIS — R590 Localized enlarged lymph nodes: Secondary | ICD-10-CM | POA: Diagnosis not present

## 2021-11-30 DIAGNOSIS — I890 Lymphedema, not elsewhere classified: Secondary | ICD-10-CM

## 2021-11-30 DIAGNOSIS — R293 Abnormal posture: Secondary | ICD-10-CM

## 2021-11-30 DIAGNOSIS — C4359 Malignant melanoma of other part of trunk: Secondary | ICD-10-CM
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
## 2021-12-01 ENCOUNTER — Ambulatory Visit: Payer: 59

## 2021-12-01 DIAGNOSIS — C4359 Malignant melanoma of other part of trunk: Secondary | ICD-10-CM

## 2021-12-01 DIAGNOSIS — R590 Localized enlarged lymph nodes: Secondary | ICD-10-CM | POA: Diagnosis not present

## 2021-12-01 DIAGNOSIS — I890 Lymphedema, not elsewhere classified: Secondary | ICD-10-CM

## 2021-12-01 DIAGNOSIS — M25612 Stiffness of left shoulder, not elsewhere classified: Secondary | ICD-10-CM

## 2021-12-01 DIAGNOSIS — R293 Abnormal posture: Secondary | ICD-10-CM
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
## 2021-12-05 ENCOUNTER — Ambulatory Visit: Payer: 59

## 2021-12-05 ENCOUNTER — Other Ambulatory Visit: Payer: Self-pay

## 2021-12-05 DIAGNOSIS — R590 Localized enlarged lymph nodes: Secondary | ICD-10-CM | POA: Diagnosis not present

## 2021-12-05 DIAGNOSIS — C4359 Malignant melanoma of other part of trunk: Secondary | ICD-10-CM

## 2021-12-05 DIAGNOSIS — R293 Abnormal posture: Secondary | ICD-10-CM

## 2021-12-05 DIAGNOSIS — I890 Lymphedema, not elsewhere classified: Secondary | ICD-10-CM

## 2021-12-05 DIAGNOSIS — M25612 Stiffness of left shoulder, not elsewhere classified: Secondary | ICD-10-CM
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
## 2021-12-07 ENCOUNTER — Ambulatory Visit: Payer: 59

## 2021-12-07 ENCOUNTER — Other Ambulatory Visit: Payer: Self-pay

## 2021-12-07 DIAGNOSIS — R293 Abnormal posture: Secondary | ICD-10-CM

## 2021-12-07 DIAGNOSIS — I890 Lymphedema, not elsewhere classified: Secondary | ICD-10-CM

## 2021-12-07 DIAGNOSIS — R590 Localized enlarged lymph nodes: Secondary | ICD-10-CM

## 2021-12-07 DIAGNOSIS — C4359 Malignant melanoma of other part of trunk: Secondary | ICD-10-CM

## 2021-12-07 DIAGNOSIS — M25612 Stiffness of left shoulder, not elsewhere classified: Secondary | ICD-10-CM
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
## 2021-12-08 ENCOUNTER — Ambulatory Visit: Payer: 59

## 2021-12-08 DIAGNOSIS — C4359 Malignant melanoma of other part of trunk: Secondary | ICD-10-CM

## 2021-12-08 DIAGNOSIS — R590 Localized enlarged lymph nodes: Secondary | ICD-10-CM | POA: Diagnosis not present

## 2021-12-08 DIAGNOSIS — M25612 Stiffness of left shoulder, not elsewhere classified: Secondary | ICD-10-CM

## 2021-12-08 DIAGNOSIS — R293 Abnormal posture: Secondary | ICD-10-CM

## 2021-12-08 DIAGNOSIS — I890 Lymphedema, not elsewhere classified: Secondary | ICD-10-CM

## 2021-12-08 NOTE — Patient Instructions (Signed)
SHOULDER: Flexion - Supine (Cane)        Cancer Rehab [phone]    Hold cane in both hands. Raise arms up overhead. Do not allow back to arch. Hold _5__ seconds. Do __5-10__ times; __1-2__ times a day.  Hands shoulder width  Hands lsightly wider apart "V" position    Shoulder Blade Stretch    Clasp fingers behind head with elbows touching in front of face. Pull elbows back while pressing shoulder blades together. Relax and hold as tolerated, can place pillow under elbow here for comfort as needed and to allow for prolonged stretch.  Repeat __5__ times. Do __1-2__ sessions per day.
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
## 2021-12-12 ENCOUNTER — Other Ambulatory Visit: Payer: Self-pay

## 2021-12-12 ENCOUNTER — Ambulatory Visit: Payer: 59

## 2021-12-12 DIAGNOSIS — R590 Localized enlarged lymph nodes: Secondary | ICD-10-CM | POA: Diagnosis not present

## 2021-12-12 DIAGNOSIS — C4359 Malignant melanoma of other part of trunk: Secondary | ICD-10-CM

## 2021-12-12 DIAGNOSIS — R293 Abnormal posture: Secondary | ICD-10-CM

## 2021-12-12 DIAGNOSIS — I890 Lymphedema, not elsewhere classified: Secondary | ICD-10-CM

## 2021-12-12 DIAGNOSIS — M25612 Stiffness of left shoulder, not elsewhere classified: Secondary | ICD-10-CM

## 2021-12-12 NOTE — Patient Instructions (Signed)
Over Head Pull: Narrow and Wide Grip   Cancer Rehab [phone]   On back, knees bent, feet flat, band across thighs, elbows straight but relaxed. Pull hands apart (start). Keeping elbows straight, bring arms up and over head, hands toward floor. Keep pull steady on band. Hold momentarily. Return slowly, keeping pull steady, back to start. Then do same with a wider grip on the band (past shoulder width) Repeat _5-10__ times. Band color __yellow____   Side Pull: Double Arm   On back, knees bent, feet flat. Arms perpendicular to body, shoulder level, elbows straight but relaxed. Pull arms out to sides, elbows straight. Resistance band comes across collarbones, hands toward floor. Hold momentarily. Slowly return to starting position. Repeat _5-10__ times. Band color _yellow____   Shoulder Rotation: Double Arm   On back, knees bent, feet flat, elbows tucked at sides, bent 90, hands palms up. Pull hands apart and down toward floor, keeping elbows near sides. Hold momentarily. Slowly return to starting position. Repeat _5-10__ times. Band color __yellow____    
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
## 2021-12-14 ENCOUNTER — Other Ambulatory Visit: Payer: Self-pay

## 2021-12-14 ENCOUNTER — Ambulatory Visit: Payer: 59

## 2021-12-14 ENCOUNTER — Encounter: Payer: Self-pay | Admitting: Oncology

## 2021-12-14 DIAGNOSIS — I890 Lymphedema, not elsewhere classified: Secondary | ICD-10-CM

## 2021-12-14 DIAGNOSIS — M25612 Stiffness of left shoulder, not elsewhere classified: Secondary | ICD-10-CM

## 2021-12-14 DIAGNOSIS — R293 Abnormal posture: Secondary | ICD-10-CM

## 2021-12-14 DIAGNOSIS — C4359 Malignant melanoma of other part of trunk: Secondary | ICD-10-CM

## 2021-12-14 DIAGNOSIS — R590 Localized enlarged lymph nodes: Secondary | ICD-10-CM | POA: Diagnosis not present
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
## 2021-12-14 NOTE — Progress Notes (Signed)
° °                                                                                                                                                          °  Patient Name: Ryan Chandler MRN: [id number] DOB: 01/15/1966 Referring Physician: Zola Button (Profile Not Attached) Date of Service: 11/17/2021  [facility] [place] [name]                                                        End Of Treatment Note  Diagnoses: C43.59-Malignant melanoma of other part of trunk C43.8-Malignant melanoma of overlapping sites of skin  Cancer Staging:  Cancer Staging  Melanoma of skin ([place]) Staging form: Melanoma of the Skin, AJCC 8th Edition - Clinical: Stage III (cTX, cN3c, cM0) - Signed by [name], MD on 12/30/2020  Intent: Curative  Radiation Treatment Dates: 10/20/2021 through 11/17/2021 Site Technique Total Dose (Gy) Dose per Fx (Gy) Completed Fx Beam Energies  Axilla, Left: Axilla_Lt_SCV 3D 48/48 2.4 20/20 6X, 10X   Narrative: The patient tolerated radiation therapy relatively well.   Plan: The patient will follow-up with radiation oncology in 66mo . -----------------------------------  Eppie Gibson, MD

## 2021-12-14 NOTE — Patient Instructions (Signed)

## 2021-12-15 ENCOUNTER — Ambulatory Visit: Payer: 59

## 2021-12-15 DIAGNOSIS — R293 Abnormal posture: Secondary | ICD-10-CM

## 2021-12-15 DIAGNOSIS — I890 Lymphedema, not elsewhere classified: Secondary | ICD-10-CM

## 2021-12-15 DIAGNOSIS — C4359 Malignant melanoma of other part of trunk: Secondary | ICD-10-CM

## 2021-12-15 DIAGNOSIS — M25612 Stiffness of left shoulder, not elsewhere classified: Secondary | ICD-10-CM

## 2021-12-15 DIAGNOSIS — R590 Localized enlarged lymph nodes: Secondary | ICD-10-CM
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
## 2021-12-15 NOTE — Patient Instructions (Signed)

## 2021-12-19 ENCOUNTER — Ambulatory Visit: Payer: 59

## 2021-12-19 ENCOUNTER — Other Ambulatory Visit: Payer: Self-pay

## 2021-12-19 DIAGNOSIS — C4359 Malignant melanoma of other part of trunk: Secondary | ICD-10-CM

## 2021-12-19 DIAGNOSIS — I890 Lymphedema, not elsewhere classified: Secondary | ICD-10-CM

## 2021-12-19 DIAGNOSIS — R590 Localized enlarged lymph nodes: Secondary | ICD-10-CM

## 2021-12-19 DIAGNOSIS — M25612 Stiffness of left shoulder, not elsewhere classified: Secondary | ICD-10-CM

## 2021-12-19 DIAGNOSIS — R293 Abnormal posture: Secondary | ICD-10-CM

## 2021-12-19 NOTE — Patient Instructions (Signed)
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
## 2021-12-20 ENCOUNTER — Encounter: Payer: Self-pay | Admitting: Oncology

## 2021-12-20 ENCOUNTER — Inpatient Hospital Stay: Payer: 59 | Attending: Oncology

## 2021-12-20 DIAGNOSIS — C439 Malignant melanoma of skin, unspecified: Secondary | ICD-10-CM

## 2021-12-20 DIAGNOSIS — C4359 Malignant melanoma of other part of trunk: Secondary | ICD-10-CM | POA: Diagnosis present

## 2021-12-20 DIAGNOSIS — R21 Rash and other nonspecific skin eruption: Secondary | ICD-10-CM | POA: Insufficient documentation

## 2021-12-20 LAB — CBC WITH DIFFERENTIAL (CANCER CENTER ONLY)
Abs Immature Granulocytes: 0.02 10*3/uL (ref 0.00–0.07)
Basophils Absolute: 0 10*3/uL (ref 0.0–0.1)
Basophils Relative: 1 %
Eosinophils Absolute: 0.4 10*3/uL (ref 0.0–0.5)
Eosinophils Relative: 10 %
HCT: 40.7 % (ref 39.0–52.0)
Hemoglobin: 14 g/dL (ref 13.0–17.0)
Immature Granulocytes: 1 %
Lymphocytes Relative: 18 %
Lymphs Abs: 0.8 10*3/uL (ref 0.7–4.0)
MCH: 28.4 pg (ref 26.0–34.0)
MCHC: 34.4 g/dL (ref 30.0–36.0)
MCV: 82.6 fL (ref 80.0–100.0)
Monocytes Absolute: 0.5 10*3/uL (ref 0.1–1.0)
Monocytes Relative: 11 %
Neutro Abs: 2.7 10*3/uL (ref 1.7–7.7)
Neutrophils Relative %: 59 %
Platelet Count: 181 10*3/uL (ref 150–400)
RBC: 4.93 MIL/uL (ref 4.22–5.81)
RDW: 12.5 % (ref 11.5–15.5)
WBC Count: 4.4 10*3/uL (ref 4.0–10.5)
nRBC: 0 % (ref 0.0–0.2)

## 2021-12-20 LAB — CMP (CANCER CENTER ONLY)
ALT: 8 U/L (ref 0–44)
AST: 8 U/L — ABNORMAL LOW (ref 15–41)
Albumin: 4 g/dL (ref 3.5–5.0)
Alkaline Phosphatase: 58 U/L (ref 38–126)
Anion gap: 6 (ref 5–15)
BUN: 10 mg/dL (ref 6–20)
CO2: 30 mmol/L (ref 22–32)
Calcium: 9.1 mg/dL (ref 8.9–10.3)
Chloride: 97 mmol/L — ABNORMAL LOW (ref 98–111)
Creatinine: 0.75 mg/dL (ref 0.61–1.24)
GFR, Estimated: >60 mL/min (ref >60)
Glucose, Bld: 337 mg/dL — ABNORMAL HIGH (ref 70–99)
Potassium: 3.6 mmol/L (ref 3.5–5.1)
Sodium: 133 mmol/L — ABNORMAL LOW (ref 135–145)
Total Bilirubin: 0.4 mg/dL (ref 0.3–1.2)
Total Protein: 7 g/dL (ref 6.5–8.1)

## 2021-12-21 ENCOUNTER — Ambulatory Visit (HOSPITAL_COMMUNITY)
Admission: RE | Admit: 2021-12-21 | Discharge: 2021-12-21 | Disposition: A | Discharge status: Home / Self Care | Payer: 59 | Source: Ambulatory Visit | Attending: Oncology | Admitting: Oncology

## 2021-12-21 ENCOUNTER — Other Ambulatory Visit: Payer: Self-pay

## 2021-12-21 ENCOUNTER — Ambulatory Visit: Payer: 59 | Attending: Radiation Oncology

## 2021-12-21 DIAGNOSIS — R590 Localized enlarged lymph nodes: Secondary | ICD-10-CM

## 2021-12-21 DIAGNOSIS — I890 Lymphedema, not elsewhere classified: Secondary | ICD-10-CM

## 2021-12-21 DIAGNOSIS — C4359 Malignant melanoma of other part of trunk: Secondary | ICD-10-CM | POA: Diagnosis present

## 2021-12-21 DIAGNOSIS — C439 Malignant melanoma of skin, unspecified: Secondary | ICD-10-CM | POA: Diagnosis present

## 2021-12-21 DIAGNOSIS — R293 Abnormal posture: Secondary | ICD-10-CM

## 2021-12-21 DIAGNOSIS — M25612 Stiffness of left shoulder, not elsewhere classified: Secondary | ICD-10-CM | POA: Diagnosis present

## 2021-12-21 LAB — GLUCOSE, CAPILLARY: Glucose-Capillary: 366 mg/dL — ABNORMAL HIGH (ref 70–99)

## 2021-12-21 MED ORDER — FLUDEOXYGLUCOSE F - 18 (FDG) INJECTION: 905.0000 | Freq: Once | INTRAVENOUS | Status: DC

## 2021-12-21 MED ORDER — FLUDEOXYGLUCOSE F - 18 (FDG) INJECTION
9.5000 | Freq: Once | INTRAVENOUS | Status: AC
  Administered 2021-12-21: 9.5 via INTRAVENOUS
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
## 2021-12-22 NOTE — Progress Notes (Signed)
Ryan Chandler presents today for follow-up after completing radiation to his left axilla and subclavian on 11/17/2021  Pain: Reports 5 out of 10 pain to left axilla. States he normally just tolerates the discomfort, but if he needs to he'll take OTC Tylenol Skin: Reports dry peeling that occurred about 1 week after he finished radiation. States that now his skin is intact and well healed Swelling: Yes--reports left arm and axilla are noticeable more swollen than the right. Continues with OP-PT 3x week and is hoping to get fitted for a compression sleeve to help manage lymphedema ROM: Reports discomfort with some range of motion due to cording.  MedOnc F/U: Scheduled to see Ryan Chandler on 12/27/2021 Other issues of note: Denies any other concerns

## 2021-12-23 ENCOUNTER — Telehealth: Payer: Self-pay | Admitting: *Deleted

## 2021-12-23 ENCOUNTER — Ambulatory Visit
Admission: RE | Admit: 2021-12-23 | Discharge: 2021-12-23 | Disposition: A | Discharge status: Home / Self Care | Payer: 59 | Source: Ambulatory Visit | Attending: Radiation Oncology | Admitting: Radiation Oncology

## 2021-12-23 ENCOUNTER — Other Ambulatory Visit: Payer: Self-pay

## 2021-12-23 ENCOUNTER — Ambulatory Visit: Payer: 59

## 2021-12-23 VITALS — BP 123/83 | HR 94 | Temp 97.8°F | Resp 18 | Ht 74.0 in | Wt 200.5 lb

## 2021-12-23 DIAGNOSIS — R590 Localized enlarged lymph nodes: Secondary | ICD-10-CM | POA: Diagnosis not present

## 2021-12-23 DIAGNOSIS — C4359 Malignant melanoma of other part of trunk: Secondary | ICD-10-CM | POA: Insufficient documentation

## 2021-12-23 DIAGNOSIS — R293 Abnormal posture: Secondary | ICD-10-CM

## 2021-12-23 DIAGNOSIS — M25612 Stiffness of left shoulder, not elsewhere classified: Secondary | ICD-10-CM

## 2021-12-23 DIAGNOSIS — I890 Lymphedema, not elsewhere classified: Secondary | ICD-10-CM
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
## 2021-12-23 NOTE — Patient Instructions (Signed)
Pt instructed in self MLD to left chest and given written instructions

## 2021-12-23 NOTE — Telephone Encounter (Signed)
Received call regarding Ryan Chandler at registrar number one reporting radiation staff directed him upstairs.  Registrar advised Ryan Chandler to wait in lobby for this nurse currently occupied on work requiring immediate action before provider end of work.  Agreed to wait per registrar.  "I need to speak with someone today about letter and calls received.  Have not received disability pay since November 2022.  No pay effective October 20, 2021 and beyond and will loose employment if [name] does not receive medical records requested for my claim no.# [id number] by 01/06/2022.  They ned everything from every provider including Radiation, Physical Therapy and even sent request to surgeon."  Apologized for delay and any inconvenience.  Obtained signed release.  Faxed patient letter marked "URGENT" to (SW) [facility].I.[facility] [phone] with request other forms nurse noted per EMR faxed to [facility] on 12/15/2021 addressed correctly to "[facility]"        Noted previous requests completed 08/16/2021 followed by record request sent to (SW) HIM per [facility] occurred 08/24/2021, 10/17/2021, 10/21/2021 and 11/04/2021 .   Recent request (perhaps a disability update) received 12/09/2021 pending per forms tracker.

## 2021-12-26 ENCOUNTER — Encounter: Payer: Self-pay | Admitting: Radiation Oncology

## 2021-12-26 ENCOUNTER — Encounter: Payer: Self-pay | Admitting: Physical Therapy

## 2021-12-26 ENCOUNTER — Other Ambulatory Visit: Payer: Self-pay

## 2021-12-26 ENCOUNTER — Ambulatory Visit: Payer: 59 | Admitting: Physical Therapy

## 2021-12-26 DIAGNOSIS — R590 Localized enlarged lymph nodes: Secondary | ICD-10-CM | POA: Diagnosis not present

## 2021-12-26 DIAGNOSIS — R293 Abnormal posture: Secondary | ICD-10-CM

## 2021-12-26 DIAGNOSIS — I890 Lymphedema, not elsewhere classified: Secondary | ICD-10-CM

## 2021-12-26 DIAGNOSIS — C4359 Malignant melanoma of other part of trunk: Secondary | ICD-10-CM

## 2021-12-26 DIAGNOSIS — M25612 Stiffness of left shoulder, not elsewhere classified: Secondary | ICD-10-CM

## 2021-12-26 NOTE — Progress Notes (Signed)
Radiation Oncology         [phone] ________________________________  Name: Ryan Chandler MRN: [id number]  Date: 12/23/2021  DOB: 1966/05/19  Follow-Up Visit Note  Outpatient  CC: [name], MD  [name], MD  Diagnosis and Prior Radiotherapy:    ICD-10-CM   1. Malignant melanoma of axilla [facility])  C43.59       Radiation Treatment Dates: 10/20/2021 through 11/17/2021 Site Technique Total Dose (Gy) Dose per Fx (Gy) Completed Fx Beam Energies  Axilla, Left: Axilla_Lt_SCV 3D 48/48 2.4 20/20 6X, 10X   CHIEF COMPLAINT: Here for follow-up and surveillance of melanoma  Narrative:  The patient returns today for routine follow-up.  Mr. Dangerfield presents today for follow-up after completing radiation to his left axilla and SCV nodes on 11/17/2021  Pain: Reports 5 out of 10 pain to left axilla. States he normally just tolerates the discomfort, but if he needs to he'll take OTC Tylenol Skin: Reports dry peeling that occurred about 1 week after he finished radiation. States that now his skin is intact and well healed Swelling: Yes--reports left arm and axilla are noticeable more swollen than the right. Continues with OP-PT 3x week and is hoping to get fitted for a compression sleeve to help manage lymphedema ROM: Reports discomfort with some range of motion due to cording.  MedOnc F/U: Scheduled to see Dr. Zola Button on 12/27/2021 Other issues of note: Denies any other concerns                              ALLERGIES:  has No Known Allergies.  Meds: Current Outpatient Medications  Medication Sig Dispense Refill   aspirin 81 MG EC tablet      Blood Glucose Monitoring Suppl (ONE TOUCH ULTRA 2) w/Device KIT      dabrafenib mesylate (TAFINLAR) 75 MG capsule Take 2 capsules (150 mg total) by mouth 2 (two) times daily. Take on an empty stomach 1 hour before or 2 hours after meals. 120 capsule 0   empagliflozin (JARDIANCE) 25 MG TABS tablet Take 25 mg by mouth daily.      ergocalciferol (VITAMIN D2) 1.25 MG (50000 UT) capsule Take 1 capsule by mouth once a week.     HYDROcodone-acetaminophen (NORCO/VICODIN) 5-325 MG tablet Take 1 tablet by mouth every 6 (six) hours as needed.     hydrOXYzine (ATARAX/VISTARIL) 50 MG tablet Take 50 mg by mouth 3 (three) times daily as needed.     insulin glargine, 1 Unit Dial, (TOUJEO SOLOSTAR) 300 UNIT/ML Solostar Pen      lisinopril (PRINIVIL,ZESTRIL) 5 MG tablet Take 5 mg by mouth daily.     metFORMIN (GLUCOPHAGE) 1000 MG tablet Take 1,000 mg by mouth 2 (two) times daily.     ONE TOUCH CLUB LANCETS MISC      prochlorperazine (COMPAZINE) 10 MG tablet Take 1 tablet (10 mg total) by mouth every 6 (six) hours as needed for nausea or vomiting. 30 tablet 0   simvastatin (ZOCOR) 40 MG tablet Take 40 mg by mouth daily.     trametinib dimethyl sulfoxide (MEKINIST) 2 MG tablet Take 1 tablet (2 mg total) by mouth daily. Take 1 hour before or 2 hours after a meal. Store refrigerated in original container. 30 tablet 0   triamcinolone ointment (KENALOG) 0.5 % Apply 1 application topically 2 (two) times daily. 30 g 0   No current facility-administered medications for this encounter.    Physical Findings:  The patient is in no acute distress. Patient is alert and oriented.  height is 6' 2"  (1.88 m) and weight is 200 lb 8 oz (90.9 kg). His temporal temperature is 97.8 F (36.6 C). His blood pressure is 123/83 and his pulse is 94. His respiration is 18 and oxygen saturation is 100%. .    Skin healing well in RT fields Lymphedema has improved in left arm/axilla since starting PT  Lab Findings: Lab Results  Component Value Date   WBC 4.4 12/20/2021   HGB 14.0 12/20/2021   HCT 40.7 12/20/2021   MCV 82.6 12/20/2021   PLT 181 12/20/2021    Radiographic Findings: NM PET Image Restage (PS) Whole Body  Result Date: 12/21/2021 CLINICAL DATA:  Subsequent treatment strategy for melanoma. EXAM: NUCLEAR MEDICINE PET WHOLE BODY TECHNIQUE: 9.5 mCi  F-18 FDG was injected intravenously. Full-ring PET imaging was performed from the head to foot after the radiotracer. CT data was obtained and used for attenuation correction and anatomic localization. Fasting blood glucose: 366 mg/dl COMPARISON:  CT 04/15/2021 com PET-CT scan 12/14/2020 FINDINGS: Mediastinal blood pool activity: SUV max 2.7, liver 3.6 HEAD/NECK: No hypermetabolic activity in the scalp. No hypermetabolic cervical lymph nodes. Incidental CT findings: none CHEST: Interval resection of the LEFT hypermetabolic axillary mass. Postsurgical seroma remains measuring 7.0 x 4.3 cm (is 145/series 3) without significant metabolic activity. No hypermetabolic mediastinal nodes. No suspicious pulmonary nodules. Incidental CT findings: none ABDOMEN/PELVIS: No abnormal hypermetabolic activity within the liver, pancreas, adrenal glands, or spleen. No hypermetabolic lymph nodes in the abdomen or pelvis. Incidental CT findings: Coarse calcifications in the gallbladder. S[place]LETON: No focal hypermetabolic activity to suggest skeletal metastasis. Incidental CT findings: none EXTREMITIES: No abnormal hypermetabolic activity in the lower extremities. Incidental CT findings: none IMPRESSION: 1. No evidence metastatic melanoma on whole-body FDG PET scan. 2. Postsurgical change in the LEFT axilla with benign-appearing seroma. Electronically Signed   By: Suzy Bouchard M.D.   On: 12/21/2021 11:55    Impression/Plan:   He is healing well after radiation to the left SCV/axillary nodes.  Continue follow-up with med/onc, continue PT, continue moisturizing skin with lotion BID in RT fields for 2 months.  Patient had concerns about insurance forms - connected with financial advocate today.  I will see him back PRN.  On date of service, in total, I spent 20 minutes on this encounter. Patient was seen in person.  _____________________________________   Eppie Gibson, MD
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
## 2021-12-27 ENCOUNTER — Inpatient Hospital Stay: Payer: 59 | Attending: Oncology | Admitting: Oncology

## 2021-12-27 VITALS — BP 141/75 | HR 92 | Temp 97.9°F | Resp 16 | Ht 74.0 in | Wt 196.1 lb

## 2021-12-27 DIAGNOSIS — C439 Malignant melanoma of skin, unspecified: Secondary | ICD-10-CM | POA: Diagnosis not present

## 2021-12-27 DIAGNOSIS — C4359 Malignant melanoma of other part of trunk: Secondary | ICD-10-CM | POA: Insufficient documentation

## 2021-12-27 NOTE — Progress Notes (Signed)
Hematology and Oncology Follow Up Visit  Ryan Chandler [id number] June 27, 1966 56 y.o. 12/27/2021 2:49 PM [name], [name], Ryan Reaper, MD   Principle Diagnosis: 56 year old man with stage III melanoma of unknown primary presented with bulky left axilla lymphadenopathy diagnosed in January 2022.    Prior Therapy:   He is status post biopsy with ultrasound guidance completed in January 2022 confirmed the presence of melanoma.  Ipilimumab 1 mg/kg and nivolumab 3 mg/kg neoadjuvant treatment started on January 14, 2021.  He completed 4 cycles of therapy with stable disease   He is status post surgical resection and lymph node dissection completed on August 22, 2021 at [facility].  The final pathology showed 1 out of 9 lymph nodes involved with malignancy.  Tafinlar 87m capsules, 2 capsules (1542m by mouth twice daily with  Mekinist 2 mg tablets,1 tablet by mouth once daily started on April 27, 2021.  Therapy discontinued in October 2022.  Radiation therapy to the left axilla completed in December 2022.  He received 48 Gray in 20 fractions.  Current therapy: Active surveillance.  Interim History: Ryan Chandler today for a follow-up visit.  Since last visit, he reports no major changes in his health.  He is tolerated radiation therapy after surgery without any major complications.  He does report some fatigue and tiredness but overall performance status quality of life remains unchanged.  He does report some mild swelling in his left arm but overall continues to perform activities of daily living.     Medications: Reviewed without changes. Current Outpatient Medications  Medication Sig Dispense Refill   aspirin 81 MG EC tablet      Blood Glucose Monitoring Suppl (ONE TOUCH ULTRA 2) w/Device KIT      dabrafenib mesylate (TAFINLAR) 75 MG capsule Take 2 capsules (150 mg total) by mouth 2 (two) times daily. Take on an empty stomach 1 hour before or 2 hours after  meals. 120 capsule 0   empagliflozin (JARDIANCE) 25 MG TABS tablet Take 25 mg by mouth daily.     ergocalciferol (VITAMIN D2) 1.25 MG (50000 UT) capsule Take 1 capsule by mouth once a week.     HYDROcodone-acetaminophen (NORCO/VICODIN) 5-325 MG tablet Take 1 tablet by mouth every 6 (six) hours as needed.     hydrOXYzine (ATARAX/VISTARIL) 50 MG tablet Take 50 mg by mouth 3 (three) times daily as needed.     insulin glargine, 1 Unit Dial, (TOUJEO SOLOSTAR) 300 UNIT/ML Solostar Pen      lisinopril (PRINIVIL,ZESTRIL) 5 MG tablet Take 5 mg by mouth daily.     metFORMIN (GLUCOPHAGE) 1000 MG tablet Take 1,000 mg by mouth 2 (two) times daily.     ONE TOUCH CLUB LANCETS MISC      prochlorperazine (COMPAZINE) 10 MG tablet Take 1 tablet (10 mg total) by mouth every 6 (six) hours as needed for nausea or vomiting. 30 tablet 0   simvastatin (ZOCOR) 40 MG tablet Take 40 mg by mouth daily.     trametinib dimethyl sulfoxide (MEKINIST) 2 MG tablet Take 1 tablet (2 mg total) by mouth daily. Take 1 hour before or 2 hours after a meal. Store refrigerated in original container. 30 tablet 0   triamcinolone ointment (KENALOG) 0.5 % Apply 1 application topically 2 (two) times daily. 30 g 0   No current facility-administered medications for this visit.     Allergies: No Known Allergies    Physical Exam:  Blood pressure (!) 141/75, pulse 92, temperature 97.9 F (  36.6 C), temperature source Temporal, resp. rate 16, height _0  (1.88 m), weight 196 lb 1.6 oz (89 kg), SpO2 96 %.    ECOG: 0   General appearance: Alert, awake without any distress. Head: Atraumatic without abnormalities Oropharynx: Without any thrush or ulcers. Eyes: No scleral icterus. Lymph nodes: No lymphadenopathy noted in the cervical, supraclavicular, or axillary nodes Heart:regular rate and rhythm, without any murmurs or gallops.   Lung: Clear to auscultation without any rhonchi, wheezes or dullness to percussion. Abdomin: Soft,  nontender without any shifting dullness or ascites. Musculoskeletal: No clubbing or cyanosis. Neurological: No motor or sensory deficits. Skin: No rashes or lesions.           Lab Results: Lab Results  Component Value Date   WBC 4.4 12/20/2021   HGB 14.0 12/20/2021   HCT 40.7 12/20/2021   MCV 82.6 12/20/2021   PLT 181 12/20/2021     Chemistry      Component Value Date/Time   NA 133 (L) 12/20/2021 0951   K 3.6 12/20/2021 0951   CL 97 (L) 12/20/2021 0951   CO2 30 12/20/2021 0951   BUN 10 12/20/2021 0951   CREATININE 0.75 12/20/2021 0951      Component Value Date/Time   CALCIUM 9.1 12/20/2021 0951   ALKPHOS 58 12/20/2021 0951   AST 8 (L) 12/20/2021 0951   ALT 8 12/20/2021 0951   BILITOT 0.4 12/20/2021 0951        IMPRESSION: 1. No evidence metastatic melanoma on whole-body FDG PET scan. 2. Postsurgical change in the LEFT axilla with benign-appearing seroma.     Impression and Plan:    56 year old man with:   1.    Stage III melanoma of unknown primary diagnosed in January 2022.  He received neoadjuvant treatment initially with immunotherapy and BRAF targeted therapy without any response.  He had a complete response after surgical resection and adjuvant radiation.  The natural course of this disease was reviewed at this time and treatment choices were discussed.  PET scan obtained on December 21, 2021 was personally reviewed and showed no evidence of fall metastatic disease or disease recurrence.  At this time, I recommended continued active surveillance and deferring any other systemic therapy unless he develops a relapse in the future.  He will continue to follow with surgical oncology at [facility] where imaging studies will be continued at that time.  2.  Lymphedema: He continues to participate in physical therapy at this time.     3.  Follow-up: In 6 months for repeat follow-up.  30  minutes were spent on this visit.  The time was  dedicated to reviewing laboratory data, disease status update, reviewing imaging studies and future plan of care reviewed.  Zola Button, MD 2/7/20232:49 PM

## 2021-12-28 ENCOUNTER — Encounter: Payer: Self-pay | Admitting: Oncology

## 2021-12-28 ENCOUNTER — Ambulatory Visit: Payer: 59

## 2021-12-28 ENCOUNTER — Other Ambulatory Visit: Payer: Self-pay

## 2021-12-28 DIAGNOSIS — R293 Abnormal posture: Secondary | ICD-10-CM

## 2021-12-28 DIAGNOSIS — R590 Localized enlarged lymph nodes: Secondary | ICD-10-CM | POA: Diagnosis not present

## 2021-12-28 DIAGNOSIS — M25612 Stiffness of left shoulder, not elsewhere classified: Secondary | ICD-10-CM

## 2021-12-28 DIAGNOSIS — I890 Lymphedema, not elsewhere classified: Secondary | ICD-10-CM

## 2021-12-28 DIAGNOSIS — C4359 Malignant melanoma of other part of trunk: Secondary | ICD-10-CM
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
## 2021-12-30 ENCOUNTER — Ambulatory Visit: Payer: 59

## 2021-12-30 ENCOUNTER — Other Ambulatory Visit: Payer: Self-pay

## 2021-12-30 DIAGNOSIS — I890 Lymphedema, not elsewhere classified: Secondary | ICD-10-CM

## 2021-12-30 DIAGNOSIS — R590 Localized enlarged lymph nodes: Secondary | ICD-10-CM

## 2021-12-30 DIAGNOSIS — C4359 Malignant melanoma of other part of trunk: Secondary | ICD-10-CM

## 2021-12-30 DIAGNOSIS — M25612 Stiffness of left shoulder, not elsewhere classified: Secondary | ICD-10-CM

## 2021-12-30 DIAGNOSIS — R293 Abnormal posture: Secondary | ICD-10-CM
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
## 2022-01-02 ENCOUNTER — Other Ambulatory Visit: Payer: Self-pay

## 2022-01-02 ENCOUNTER — Ambulatory Visit: Payer: 59

## 2022-01-02 DIAGNOSIS — M25612 Stiffness of left shoulder, not elsewhere classified: Secondary | ICD-10-CM

## 2022-01-02 DIAGNOSIS — R590 Localized enlarged lymph nodes: Secondary | ICD-10-CM | POA: Diagnosis not present

## 2022-01-02 DIAGNOSIS — I890 Lymphedema, not elsewhere classified: Secondary | ICD-10-CM

## 2022-01-02 DIAGNOSIS — C4359 Malignant melanoma of other part of trunk: Secondary | ICD-10-CM

## 2022-01-02 DIAGNOSIS — R293 Abnormal posture: Secondary | ICD-10-CM
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
## 2022-01-04 ENCOUNTER — Ambulatory Visit: Payer: 59

## 2022-01-04 ENCOUNTER — Other Ambulatory Visit: Payer: Self-pay

## 2022-01-04 DIAGNOSIS — R590 Localized enlarged lymph nodes: Secondary | ICD-10-CM

## 2022-01-04 DIAGNOSIS — C4359 Malignant melanoma of other part of trunk: Secondary | ICD-10-CM

## 2022-01-04 DIAGNOSIS — I890 Lymphedema, not elsewhere classified: Secondary | ICD-10-CM

## 2022-01-04 DIAGNOSIS — M25612 Stiffness of left shoulder, not elsewhere classified: Secondary | ICD-10-CM

## 2022-01-04 DIAGNOSIS — R293 Abnormal posture: Secondary | ICD-10-CM
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
## 2022-01-06 ENCOUNTER — Other Ambulatory Visit: Payer: Self-pay

## 2022-01-06 ENCOUNTER — Ambulatory Visit: Payer: 59

## 2022-01-06 DIAGNOSIS — R590 Localized enlarged lymph nodes: Secondary | ICD-10-CM | POA: Diagnosis not present

## 2022-01-06 DIAGNOSIS — M25612 Stiffness of left shoulder, not elsewhere classified: Secondary | ICD-10-CM

## 2022-01-06 DIAGNOSIS — I890 Lymphedema, not elsewhere classified: Secondary | ICD-10-CM

## 2022-01-06 DIAGNOSIS — R293 Abnormal posture: Secondary | ICD-10-CM

## 2022-01-06 DIAGNOSIS — C4359 Malignant melanoma of other part of trunk: Secondary | ICD-10-CM
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
## 2022-01-09 ENCOUNTER — Ambulatory Visit: Payer: 59

## 2022-01-09 ENCOUNTER — Other Ambulatory Visit: Payer: Self-pay

## 2022-01-09 DIAGNOSIS — M25612 Stiffness of left shoulder, not elsewhere classified: Secondary | ICD-10-CM

## 2022-01-09 DIAGNOSIS — R590 Localized enlarged lymph nodes: Secondary | ICD-10-CM

## 2022-01-09 DIAGNOSIS — I890 Lymphedema, not elsewhere classified: Secondary | ICD-10-CM

## 2022-01-09 DIAGNOSIS — C4359 Malignant melanoma of other part of trunk: Secondary | ICD-10-CM

## 2022-01-09 DIAGNOSIS — R293 Abnormal posture: Secondary | ICD-10-CM
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
## 2022-01-11 ENCOUNTER — Other Ambulatory Visit: Payer: Self-pay

## 2022-01-11 ENCOUNTER — Ambulatory Visit: Payer: 59

## 2022-01-11 DIAGNOSIS — C4359 Malignant melanoma of other part of trunk: Secondary | ICD-10-CM

## 2022-01-11 DIAGNOSIS — R590 Localized enlarged lymph nodes: Secondary | ICD-10-CM

## 2022-01-11 DIAGNOSIS — R293 Abnormal posture: Secondary | ICD-10-CM

## 2022-01-11 DIAGNOSIS — I890 Lymphedema, not elsewhere classified: Secondary | ICD-10-CM

## 2022-01-11 DIAGNOSIS — M25612 Stiffness of left shoulder, not elsewhere classified: Secondary | ICD-10-CM
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
## 2022-01-13 ENCOUNTER — Other Ambulatory Visit: Payer: Self-pay

## 2022-01-13 ENCOUNTER — Ambulatory Visit: Payer: 59

## 2022-01-13 DIAGNOSIS — C4359 Malignant melanoma of other part of trunk: Secondary | ICD-10-CM

## 2022-01-13 DIAGNOSIS — I890 Lymphedema, not elsewhere classified: Secondary | ICD-10-CM

## 2022-01-13 DIAGNOSIS — R293 Abnormal posture: Secondary | ICD-10-CM

## 2022-01-13 DIAGNOSIS — M25612 Stiffness of left shoulder, not elsewhere classified: Secondary | ICD-10-CM

## 2022-01-13 DIAGNOSIS — R590 Localized enlarged lymph nodes: Secondary | ICD-10-CM

## 2022-01-13 NOTE — Patient Instructions (Signed)
Access Code: [email or web address] URL: [email or web address] Date: 01/13/2022 Prepared by: Cheral Almas  Exercises Scapular Retraction with Resistance - 1 x daily - 3 x weekly - 1 sets - 10 reps Scapular Retraction with Resistance Advanced - 1 x daily - 3 x weekly - 10 reps Shoulder External Rotation and Scapular Retraction with Resistance - 1 x daily - 3 x weekly - 1 sets - 10 reps Standing Elbow Extension with Self-Anchored Resistance - 1 x daily - 3 x weekly - 1 sets - 10 reps Standing Elbow Flexion with Resistance - 1 x daily - 3 x weekly - 1 sets - 10 reps Standing Shoulder Flexion to 90 Degrees with Dumbbells - 1 x daily - 3 x weekly - 1 sets - 10 reps Standing Alternating Shoulder Scaption - 1 x daily - 1 sets - 10 reps
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
## 2022-01-18 ENCOUNTER — Other Ambulatory Visit: Payer: Self-pay

## 2022-01-18 ENCOUNTER — Ambulatory Visit: Payer: 59 | Attending: Radiation Oncology

## 2022-01-18 DIAGNOSIS — M25612 Stiffness of left shoulder, not elsewhere classified: Secondary | ICD-10-CM | POA: Diagnosis present

## 2022-01-18 DIAGNOSIS — R293 Abnormal posture: Secondary | ICD-10-CM | POA: Diagnosis present

## 2022-01-18 DIAGNOSIS — C4359 Malignant melanoma of other part of trunk: Secondary | ICD-10-CM

## 2022-01-18 DIAGNOSIS — R590 Localized enlarged lymph nodes: Secondary | ICD-10-CM | POA: Diagnosis not present

## 2022-01-18 DIAGNOSIS — I890 Lymphedema, not elsewhere classified: Secondary | ICD-10-CM | POA: Diagnosis present
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
## 2022-01-23 ENCOUNTER — Other Ambulatory Visit: Payer: Self-pay

## 2022-01-23 ENCOUNTER — Ambulatory Visit: Payer: 59

## 2022-01-23 DIAGNOSIS — I890 Lymphedema, not elsewhere classified: Secondary | ICD-10-CM

## 2022-01-23 DIAGNOSIS — R293 Abnormal posture: Secondary | ICD-10-CM

## 2022-01-23 DIAGNOSIS — C4359 Malignant melanoma of other part of trunk: Secondary | ICD-10-CM

## 2022-01-23 DIAGNOSIS — R590 Localized enlarged lymph nodes: Secondary | ICD-10-CM

## 2022-01-23 DIAGNOSIS — M25612 Stiffness of left shoulder, not elsewhere classified: Secondary | ICD-10-CM
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
## 2022-01-25 ENCOUNTER — Other Ambulatory Visit: Payer: Self-pay

## 2022-01-25 ENCOUNTER — Ambulatory Visit: Payer: 59

## 2022-01-25 DIAGNOSIS — R293 Abnormal posture: Secondary | ICD-10-CM

## 2022-01-25 DIAGNOSIS — C4359 Malignant melanoma of other part of trunk: Secondary | ICD-10-CM

## 2022-01-25 DIAGNOSIS — M25612 Stiffness of left shoulder, not elsewhere classified: Secondary | ICD-10-CM

## 2022-01-25 DIAGNOSIS — R590 Localized enlarged lymph nodes: Secondary | ICD-10-CM | POA: Diagnosis not present

## 2022-01-25 DIAGNOSIS — I890 Lymphedema, not elsewhere classified: Secondary | ICD-10-CM
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
## 2022-01-30 ENCOUNTER — Other Ambulatory Visit: Payer: Self-pay

## 2022-01-30 ENCOUNTER — Ambulatory Visit: Payer: 59

## 2022-01-30 DIAGNOSIS — R590 Localized enlarged lymph nodes: Secondary | ICD-10-CM | POA: Diagnosis not present

## 2022-01-30 DIAGNOSIS — M25612 Stiffness of left shoulder, not elsewhere classified: Secondary | ICD-10-CM

## 2022-01-30 DIAGNOSIS — R293 Abnormal posture: Secondary | ICD-10-CM

## 2022-01-30 DIAGNOSIS — I890 Lymphedema, not elsewhere classified: Secondary | ICD-10-CM

## 2022-01-30 DIAGNOSIS — C4359 Malignant melanoma of other part of trunk: Secondary | ICD-10-CM

## 2022-01-30 NOTE — Patient Instructions (Signed)
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
## 2022-02-02 ENCOUNTER — Ambulatory Visit: Payer: 59

## 2022-02-02 ENCOUNTER — Other Ambulatory Visit: Payer: Self-pay

## 2022-02-02 DIAGNOSIS — R590 Localized enlarged lymph nodes: Secondary | ICD-10-CM | POA: Diagnosis not present
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
## 2022-02-06 ENCOUNTER — Other Ambulatory Visit: Payer: Self-pay

## 2022-02-06 ENCOUNTER — Ambulatory Visit: Payer: 59

## 2022-02-06 DIAGNOSIS — C4359 Malignant melanoma of other part of trunk: Secondary | ICD-10-CM

## 2022-02-06 DIAGNOSIS — M25612 Stiffness of left shoulder, not elsewhere classified: Secondary | ICD-10-CM

## 2022-02-06 DIAGNOSIS — R590 Localized enlarged lymph nodes: Secondary | ICD-10-CM

## 2022-02-06 DIAGNOSIS — I890 Lymphedema, not elsewhere classified: Secondary | ICD-10-CM

## 2022-02-06 DIAGNOSIS — R293 Abnormal posture: Secondary | ICD-10-CM
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
## 2022-02-08 ENCOUNTER — Other Ambulatory Visit: Payer: Self-pay

## 2022-02-08 ENCOUNTER — Ambulatory Visit: Payer: 59

## 2022-02-08 DIAGNOSIS — I890 Lymphedema, not elsewhere classified: Secondary | ICD-10-CM

## 2022-02-08 DIAGNOSIS — R590 Localized enlarged lymph nodes: Secondary | ICD-10-CM

## 2022-02-08 DIAGNOSIS — R293 Abnormal posture: Secondary | ICD-10-CM

## 2022-02-08 DIAGNOSIS — C4359 Malignant melanoma of other part of trunk: Secondary | ICD-10-CM

## 2022-02-08 DIAGNOSIS — M25612 Stiffness of left shoulder, not elsewhere classified: Secondary | ICD-10-CM
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
## 2022-02-14 ENCOUNTER — Ambulatory Visit: Payer: 59

## 2022-02-14 ENCOUNTER — Other Ambulatory Visit: Payer: Self-pay

## 2022-02-14 DIAGNOSIS — I890 Lymphedema, not elsewhere classified: Secondary | ICD-10-CM

## 2022-02-14 DIAGNOSIS — R590 Localized enlarged lymph nodes: Secondary | ICD-10-CM

## 2022-02-14 DIAGNOSIS — C4359 Malignant melanoma of other part of trunk: Secondary | ICD-10-CM

## 2022-02-14 DIAGNOSIS — M25612 Stiffness of left shoulder, not elsewhere classified: Secondary | ICD-10-CM

## 2022-02-14 DIAGNOSIS — R293 Abnormal posture: Secondary | ICD-10-CM
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
## 2022-02-16 ENCOUNTER — Ambulatory Visit: Payer: 59

## 2022-02-16 DIAGNOSIS — I890 Lymphedema, not elsewhere classified: Secondary | ICD-10-CM

## 2022-02-16 DIAGNOSIS — C4359 Malignant melanoma of other part of trunk: Secondary | ICD-10-CM

## 2022-02-16 DIAGNOSIS — R590 Localized enlarged lymph nodes: Secondary | ICD-10-CM

## 2022-02-16 DIAGNOSIS — R293 Abnormal posture: Secondary | ICD-10-CM

## 2022-02-16 DIAGNOSIS — M25612 Stiffness of left shoulder, not elsewhere classified: Secondary | ICD-10-CM
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
## 2022-02-21 ENCOUNTER — Ambulatory Visit: Payer: 59 | Attending: Radiation Oncology

## 2022-02-21 DIAGNOSIS — I890 Lymphedema, not elsewhere classified: Secondary | ICD-10-CM | POA: Diagnosis present

## 2022-02-21 DIAGNOSIS — C4359 Malignant melanoma of other part of trunk: Secondary | ICD-10-CM | POA: Diagnosis present

## 2022-02-21 DIAGNOSIS — M25612 Stiffness of left shoulder, not elsewhere classified: Secondary | ICD-10-CM | POA: Diagnosis present

## 2022-02-21 DIAGNOSIS — R293 Abnormal posture: Secondary | ICD-10-CM | POA: Diagnosis present

## 2022-02-21 DIAGNOSIS — R590 Localized enlarged lymph nodes: Secondary | ICD-10-CM | POA: Insufficient documentation
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
## 2022-02-23 ENCOUNTER — Ambulatory Visit: Payer: 59

## 2022-02-23 DIAGNOSIS — R590 Localized enlarged lymph nodes: Secondary | ICD-10-CM | POA: Diagnosis not present

## 2022-02-23 DIAGNOSIS — I890 Lymphedema, not elsewhere classified: Secondary | ICD-10-CM

## 2022-02-23 DIAGNOSIS — M25612 Stiffness of left shoulder, not elsewhere classified: Secondary | ICD-10-CM

## 2022-02-23 DIAGNOSIS — C4359 Malignant melanoma of other part of trunk: Secondary | ICD-10-CM

## 2022-02-23 DIAGNOSIS — R293 Abnormal posture: Secondary | ICD-10-CM
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
## 2022-02-27 ENCOUNTER — Ambulatory Visit: Payer: 59

## 2022-02-27 DIAGNOSIS — R590 Localized enlarged lymph nodes: Secondary | ICD-10-CM | POA: Diagnosis not present

## 2022-02-27 DIAGNOSIS — C4359 Malignant melanoma of other part of trunk: Secondary | ICD-10-CM

## 2022-02-27 DIAGNOSIS — R293 Abnormal posture: Secondary | ICD-10-CM

## 2022-02-27 DIAGNOSIS — I890 Lymphedema, not elsewhere classified: Secondary | ICD-10-CM

## 2022-02-27 DIAGNOSIS — M25612 Stiffness of left shoulder, not elsewhere classified: Secondary | ICD-10-CM
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
## 2022-03-15 ENCOUNTER — Ambulatory Visit: Payer: 59

## 2022-03-15 DIAGNOSIS — R590 Localized enlarged lymph nodes: Secondary | ICD-10-CM

## 2022-03-15 DIAGNOSIS — R293 Abnormal posture: Secondary | ICD-10-CM

## 2022-03-15 DIAGNOSIS — C4359 Malignant melanoma of other part of trunk: Secondary | ICD-10-CM

## 2022-03-15 DIAGNOSIS — M25612 Stiffness of left shoulder, not elsewhere classified: Secondary | ICD-10-CM

## 2022-03-15 DIAGNOSIS — I890 Lymphedema, not elsewhere classified: Secondary | ICD-10-CM
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
## 2022-06-12 ENCOUNTER — Other Ambulatory Visit: Payer: Self-pay

## 2022-06-20 ENCOUNTER — Other Ambulatory Visit: Payer: Self-pay

## 2022-06-27 ENCOUNTER — Inpatient Hospital Stay (HOSPITAL_BASED_OUTPATIENT_CLINIC_OR_DEPARTMENT_OTHER): Payer: 59 | Admitting: Oncology

## 2022-06-27 ENCOUNTER — Inpatient Hospital Stay: Payer: 59 | Attending: Oncology

## 2022-06-27 ENCOUNTER — Other Ambulatory Visit: Payer: Self-pay

## 2022-06-27 VITALS — BP 120/70 | HR 77 | Temp 98.2°F | Resp 17 | Ht 74.0 in | Wt 195.2 lb

## 2022-06-27 DIAGNOSIS — Z85820 Personal history of malignant melanoma of skin: Secondary | ICD-10-CM | POA: Diagnosis present

## 2022-06-27 DIAGNOSIS — C439 Malignant melanoma of skin, unspecified: Secondary | ICD-10-CM

## 2022-06-27 DIAGNOSIS — Z08 Encounter for follow-up examination after completed treatment for malignant neoplasm: Secondary | ICD-10-CM | POA: Diagnosis present

## 2022-06-27 LAB — CMP (CANCER CENTER ONLY)
ALT: 9 U/L (ref 0–44)
AST: 10 U/L — ABNORMAL LOW (ref 15–41)
Albumin: 4.2 g/dL (ref 3.5–5.0)
Alkaline Phosphatase: 60 U/L (ref 38–126)
Anion gap: 7 (ref 5–15)
BUN: 11 mg/dL (ref 6–20)
CO2: 29 mmol/L (ref 22–32)
Calcium: 9.1 mg/dL (ref 8.9–10.3)
Chloride: 101 mmol/L (ref 98–111)
Creatinine: 0.81 mg/dL (ref 0.61–1.24)
GFR, Estimated: >60 mL/min (ref >60)
Glucose, Bld: 191 mg/dL — ABNORMAL HIGH (ref 70–99)
Potassium: 3.9 mmol/L (ref 3.5–5.1)
Sodium: 137 mmol/L (ref 135–145)
Total Bilirubin: 0.5 mg/dL (ref 0.3–1.2)
Total Protein: 7.3 g/dL (ref 6.5–8.1)

## 2022-06-27 LAB — CBC WITH DIFFERENTIAL (CANCER CENTER ONLY)
Abs Immature Granulocytes: 0.03 10*3/uL (ref 0.00–0.07)
Basophils Absolute: 0 10*3/uL (ref 0.0–0.1)
Basophils Relative: 1 %
Eosinophils Absolute: 0.3 10*3/uL (ref 0.0–0.5)
Eosinophils Relative: 5 %
HCT: 42.9 % (ref 39.0–52.0)
Hemoglobin: 14.7 g/dL (ref 13.0–17.0)
Immature Granulocytes: 1 %
Lymphocytes Relative: 18 %
Lymphs Abs: 0.9 10*3/uL (ref 0.7–4.0)
MCH: 29 pg (ref 26.0–34.0)
MCHC: 34.3 g/dL (ref 30.0–36.0)
MCV: 84.6 fL (ref 80.0–100.0)
Monocytes Absolute: 0.5 10*3/uL (ref 0.1–1.0)
Monocytes Relative: 10 %
Neutro Abs: 3.4 10*3/uL (ref 1.7–7.7)
Neutrophils Relative %: 65 %
Platelet Count: 229 10*3/uL (ref 150–400)
RBC: 5.07 MIL/uL (ref 4.22–5.81)
RDW: 12.9 % (ref 11.5–15.5)
WBC Count: 5.1 10*3/uL (ref 4.0–10.5)
nRBC: 0 % (ref 0.0–0.2)

## 2022-06-27 NOTE — Progress Notes (Signed)
Hematology and Oncology Follow Up Visit  Ryan Chandler [id number] 1966/09/26 56 y.o. 06/27/2022 9:18 AM Ryan Chandler, [name], [name], MD   Principle Diagnosis: 56 year old man with melanoma of unknown primary diagnosed in 2022.  He was found to have stage III presented with bulky left axilla lymphadenopathy.  Prior Therapy:   He is status post biopsy with ultrasound guidance completed in January 2022 confirmed the presence of melanoma.  Ipilimumab 1 mg/kg and nivolumab 3 mg/kg neoadjuvant treatment started on January 14, 2021.  He completed 4 cycles of therapy with stable disease   He is status post surgical resection and lymph node dissection completed on August 22, 2021 at [facility].  The final pathology showed 1 out of 9 lymph nodes involved with malignancy.  Tafinlar 81m capsules, 2 capsules (1520m by mouth twice daily with  Mekinist 2 mg tablets,1 tablet by mouth once daily started on April 27, 2021.  Therapy discontinued in October 2022.  Radiation therapy to the left axilla completed in December 2022.  He received 48 Gray in 20 fractions.  Current therapy: Active surveillance.  Interim History: Mr. ElSullengereturns today for a repeat evaluation.  Since last visit, he reports no major changes in his health.  He continues to deal with left upper extremity lymphedema and has dissipated with physical and Occupational Therapy.  Denies any nausea, vomiting or abdominal pain.  Denies any weight loss or appetite changes.     Medications: Updated on review. Current Outpatient Medications  Medication Sig Dispense Refill   aspirin 81 MG EC tablet      Blood Glucose Monitoring Suppl (ONE TOUCH ULTRA 2) w/Device KIT      dabrafenib mesylate (TAFINLAR) 75 MG capsule Take 2 capsules (150 mg total) by mouth 2 (two) times daily. Take on an empty stomach 1 hour before or 2 hours after meals. 120 capsule 0   empagliflozin (JARDIANCE) 25 MG TABS tablet Take 25 mg by mouth  daily.     ergocalciferol (VITAMIN D2) 1.25 MG (50000 UT) capsule Take 1 capsule by mouth once a week.     HYDROcodone-acetaminophen (NORCO/VICODIN) 5-325 MG tablet Take 1 tablet by mouth every 6 (six) hours as needed.     hydrOXYzine (ATARAX/VISTARIL) 50 MG tablet Take 50 mg by mouth 3 (three) times daily as needed.     insulin glargine, 1 Unit Dial, (TOUJEO SOLOSTAR) 300 UNIT/ML Solostar Pen      lisinopril (PRINIVIL,ZESTRIL) 5 MG tablet Take 5 mg by mouth daily.     metFORMIN (GLUCOPHAGE) 1000 MG tablet Take 1,000 mg by mouth 2 (two) times daily.     ONE TOUCH CLUB LANCETS MISC      prochlorperazine (COMPAZINE) 10 MG tablet Take 1 tablet (10 mg total) by mouth every 6 (six) hours as needed for nausea or vomiting. 30 tablet 0   simvastatin (ZOCOR) 40 MG tablet Take 40 mg by mouth daily.     trametinib dimethyl sulfoxide (MEKINIST) 2 MG tablet Take 1 tablet (2 mg total) by mouth daily. Take 1 hour before or 2 hours after a meal. Store refrigerated in original container. 30 tablet 0   triamcinolone ointment (KENALOG) 0.5 % Apply 1 application topically 2 (two) times daily. 30 g 0   No current facility-administered medications for this visit.     Allergies: No Known Allergies    Physical Exam:   Blood pressure 120/70, pulse 77, temperature 98.2 F (36.8 C), temperature source Temporal, resp. rate 17, height _0  (1.88  m), weight 195 lb 3.2 oz (88.5 kg), SpO2 100 %.    ECOG: 0   General appearance: Comfortable appearing without any discomfort Head: Normocephalic without any trauma Oropharynx: Mucous membranes are moist and pink without any thrush or ulcers. Eyes: Pupils are equal and round reactive to light. Lymph nodes: No cervical, supraclavicular, inguinal or axillary lymphadenopathy.   Heart:regular rate and rhythm.  S1 and S2.  Left upper extremity edema noted. Lung: Clear without any rhonchi or wheezes.  No dullness to percussion. Abdomin: Soft, nontender, nondistended  with good bowel sounds.  No hepatosplenomegaly. Musculoskeletal: No joint deformity or effusion.  Full range of motion noted. Neurological: No deficits noted on motor, sensory and deep tendon reflex exam. Skin: No petechial rash or dryness.  Appeared moist.            Lab Results: Lab Results  Component Value Date   WBC 5.1 06/27/2022   HGB 14.7 06/27/2022   HCT 42.9 06/27/2022   MCV 84.6 06/27/2022   PLT 229 06/27/2022     Chemistry      Component Value Date/Time   NA 133 (L) 12/20/2021 0951   K 3.6 12/20/2021 0951   CL 97 (L) 12/20/2021 0951   CO2 30 12/20/2021 0951   BUN 10 12/20/2021 0951   CREATININE 0.75 12/20/2021 0951      Component Value Date/Time   CALCIUM 9.1 12/20/2021 0951   ALKPHOS 58 12/20/2021 0951   AST 8 (L) 12/20/2021 0951   ALT 8 12/20/2021 0951   BILITOT 0.4 12/20/2021 0951           Impression and Plan:    56 year old man with:   1.    Melanoma of unknown primary presented with stage III disease and bulky axillary lymph node involvement.    The natural course of this disease was reviewed at this time.  He is currently on active surveillance without any evidence of relapsed disease.  Imaging studies obtained on Mar 31, 2022 were reviewed and completed at [facility].  I recommended continued active surveillance and use salvage therapy only upon relapse.   2.  Lymphedema: Continues to be an issue and not preventing him from working at this time.     3.  Follow-up: In 6 months for follow-up.  30  minutes were dedicated to this encounter.  Time spent on reviewing laboratory data, disease status update and outlining future plan of care discussion.  Zola Button, MD 8/8/20239:18 AM

## 2022-06-28 ENCOUNTER — Other Ambulatory Visit: Payer: Self-pay

## 2022-07-05 ENCOUNTER — Encounter: Payer: Self-pay | Admitting: Physician Assistant

## 2022-07-11 ENCOUNTER — Telehealth: Payer: Self-pay | Admitting: Oncology

## 2022-07-11 NOTE — Telephone Encounter (Signed)
Called patient regarding upcoming 2024 appointments, patient is notified.  

## 2022-07-19 ENCOUNTER — Emergency Department (HOSPITAL_COMMUNITY): Payer: 59

## 2022-07-19 ENCOUNTER — Emergency Department (HOSPITAL_COMMUNITY)
Admission: EM | Admit: 2022-07-19 | Discharge: 2022-07-19 | Disposition: A | Discharge status: Home / Self Care | Payer: 59 | Attending: Emergency Medicine | Admitting: Emergency Medicine

## 2022-07-19 ENCOUNTER — Encounter (HOSPITAL_COMMUNITY): Payer: Self-pay

## 2022-07-19 DIAGNOSIS — K5120 Ulcerative (chronic) proctitis without complications: Secondary | ICD-10-CM | POA: Insufficient documentation

## 2022-07-19 DIAGNOSIS — Z7984 Long term (current) use of oral hypoglycemic drugs: Secondary | ICD-10-CM | POA: Insufficient documentation

## 2022-07-19 DIAGNOSIS — Z87891 Personal history of nicotine dependence: Secondary | ICD-10-CM | POA: Diagnosis not present

## 2022-07-19 DIAGNOSIS — Z794 Long term (current) use of insulin: Secondary | ICD-10-CM | POA: Diagnosis not present

## 2022-07-19 DIAGNOSIS — K649 Unspecified hemorrhoids: Secondary | ICD-10-CM | POA: Diagnosis present

## 2022-07-19 DIAGNOSIS — I10 Essential (primary) hypertension: Secondary | ICD-10-CM | POA: Insufficient documentation

## 2022-07-19 DIAGNOSIS — Z7982 Long term (current) use of aspirin: Secondary | ICD-10-CM | POA: Insufficient documentation

## 2022-07-19 DIAGNOSIS — E119 Type 2 diabetes mellitus without complications: Secondary | ICD-10-CM | POA: Diagnosis not present

## 2022-07-19 DIAGNOSIS — K529 Noninfective gastroenteritis and colitis, unspecified: Secondary | ICD-10-CM

## 2022-07-19 LAB — BASIC METABOLIC PANEL
Anion gap: 8 (ref 5–15)
BUN: 13 mg/dL (ref 6–20)
CO2: 29 mmol/L (ref 22–32)
Calcium: 9.3 mg/dL (ref 8.9–10.3)
Chloride: 99 mmol/L (ref 98–111)
Creatinine, Ser: 0.77 mg/dL (ref 0.61–1.24)
GFR, Estimated: >60 mL/min (ref >60)
Glucose, Bld: 151 mg/dL — ABNORMAL HIGH (ref 70–99)
Potassium: 3.9 mmol/L (ref 3.5–5.1)
Sodium: 136 mmol/L (ref 135–145)

## 2022-07-19 LAB — CBC WITH DIFFERENTIAL/PLATELET
Abs Immature Granulocytes: 0.02 10*3/uL (ref 0.00–0.07)
Basophils Absolute: 0 10*3/uL (ref 0.0–0.1)
Basophils Relative: 0 %
Eosinophils Absolute: 0.4 10*3/uL (ref 0.0–0.5)
Eosinophils Relative: 6 %
HCT: 45 % (ref 39.0–52.0)
Hemoglobin: 14.8 g/dL (ref 13.0–17.0)
Immature Granulocytes: 0 %
Lymphocytes Relative: 18 %
Lymphs Abs: 1.1 10*3/uL (ref 0.7–4.0)
MCH: 28.7 pg (ref 26.0–34.0)
MCHC: 32.9 g/dL (ref 30.0–36.0)
MCV: 87.4 fL (ref 80.0–100.0)
Monocytes Absolute: 0.5 10*3/uL (ref 0.1–1.0)
Monocytes Relative: 8 %
Neutro Abs: 3.9 10*3/uL (ref 1.7–7.7)
Neutrophils Relative %: 68 %
Platelets: 233 10*3/uL (ref 150–400)
RBC: 5.15 MIL/uL (ref 4.22–5.81)
RDW: 13.1 % (ref 11.5–15.5)
WBC: 5.9 10*3/uL (ref 4.0–10.5)
nRBC: 0 % (ref 0.0–0.2)

## 2022-07-19 MED ORDER — AMOXICILLIN-POT CLAVULANATE 875-125 MG PO TABS: 1.0000 | ORAL_TABLET | Freq: Two times a day (BID) | ORAL | 0 refills | Status: DC

## 2022-07-19 MED ORDER — POLYETHYLENE GLYCOL 3350 17 GM/SCOOP PO POWD: 17.0000 g | Freq: Every day | ORAL | 0 refills | Status: AC

## 2022-07-19 MED ORDER — IOHEXOL 300 MG/ML  SOLN
100.0000 mL | Freq: Once | INTRAMUSCULAR | Status: AC | PRN
  Administered 2022-07-19: 100 mL via INTRAVENOUS

## 2022-07-19 NOTE — ED Notes (Signed)
Needs IV/ Labs @ 2218

## 2022-07-19 NOTE — ED Triage Notes (Signed)
Pt arrived via POV, c/o hemorrhoids, states he has been seen multiple times for the same, tried stool softeners and multiple creams. Referred to specialist but unable to get appointment till mid sept.

## 2022-07-19 NOTE — ED Provider Triage Note (Signed)
Emergency Medicine Provider Triage Evaluation Note  Ryan Chandler , a 56 y.o. male  was evaluated in triage.  Pt complains of hemorrhoids.  Patient states he has been using stool softeners and multiple creams with no relief.  Admits to some rectal bleeding when wiping.  Review of Systems  Positive: Rectal pain Negative: fever  Physical Exam  There were no vitals taken for this visit. Gen:   Awake, no distress   Resp:  Normal effort  MSK:   Moves extremities without difficulty  Other:   Medical Decision Making  Medically screening exam initiated at 6:21 PM.  Appropriate orders placed.  Blaise Grieshaber was informed that the remainder of the evaluation will be completed by another provider, this initial triage assessment does not replace that evaluation, and the importance of remaining in the ED until their evaluation is complete.  Will need room for exam   Karie Kirks 07/19/22 1824
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
## 2022-08-04 NOTE — Progress Notes (Unsigned)
08/07/2022 Ryan Chandler [id number] 1965/11/29  Referring provider: [name], MD Primary GI doctor: Dr. Loletha Carrow  ASSESSMENT AND PLAN:   Assessment: 56 y.o. male here for assessment of the following: 1. Rectal pain   2. Proctitis   3. 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 Long term (current) use of insulin ([place])   5. History of adenomatous polyp of colon    56 year old male with history of recently treated melanoma with metastasis to left axilla 2022, treated with surgery, chemotherapy and radiation to left axilla presents with 3 months of rectal pain. Colonoscopy 2018 2 mm TA polyp, recall due this year. Went to the ER which showed rectosigmoid thickening proctocolitis Patient having continued rectal pain, some response to Augmentin but continues to have rectal pain for at least 1 to 2 hours after bowel movements. On rectal exam today unable to do adequate exam due to pain, possible mild stenosis, possible external skin tag versus condyloma. Denies history of anal intercourse. Sadly patient also states he is going to lose his insurance on Wednesday, and then have to apply for Obama care since he lost his job, not be able to get insurance again until November or December.  Plan: Can increase miralax to twice a day Colonoscopy recommended to exclude inflammatory bowel disease, ischemic colitis, and rectal cancer. May ultimately need referral to surgery pending findings.  Due to patient losing his insurance, discussed with the patient willing to do colonoscopy tomorrow.  He has not had any food this morning, will do clear liquid diet today, prep today and plan for colonoscopy tomorrow at 130 with Dr. Havery Moros. Discussed with Dr. Havery Moros and he is okay with this. We have discussed the risks of bleeding, infection, perforation, medication reactions, and remote risk of death associated with colonoscopy. All questions were answered and the patient acknowledges these risk  and wishes to proceed.  Since patient did respond well to Augmentin, will do sample of doxycycline as this also treats gonorrhea chlamydia. Given topical therapy with diltaziem/lidocaine, given recticare samples.   Orders Placed This Encounter  Procedures   Ambulatory referral to Gastroenterology    Meds ordered this encounter  Medications   doxycycline (VIBRA-TABS) 100 MG tablet    Sig: Take 1 tablet (100 mg total) by mouth 2 (two) times daily.    Dispense:  20 tablet    Refill:  0   NON FORMULARY    Sig: Diltiazem 2%/Lidocaine5% compound Use 3 x rectally daily for 2 months to heal anal fissure    Dispense:  30 g    Refill:  1    Patient Care Team: [name], MD as PCP - General (Internal Medicine) Arvella Nigh, MD (Chiropractic Medicine)  HISTORY OF PRESENT ILLNESS: 56 y.o. male with a past medical history of melanoma with mets to left axilla diagnosed 2022 follows with Dr. [name], last visit 06/27/2022 status post chemotherapy and surgical postresection lymph node 08/2021 at [facility], and radiation therapy, diabetes insulin-dependent, hypertension, cholesterol and others listed below presents for evaluation of proctocolitis.   07/24/2017 colonoscopy Dr. Loletha Carrow for screening with good prep 2 mm TA polyp otherwise unremarkable.  Recall 5 years (07/2022) 07/19/2022 went to the ER with rectal pain for months.  Denied hematochezia, diarrhea, fever chills, weight loss. CT abdomen pelvis with contrast for rectal pain in the ER showed mild rectosigmoid wall thickening concerning for proctocolitis, diverticulosis without evidence of diverticulitis and cholelithiasis without cholecystitis.  No ductal dilatation. Labs in the ER showed  no leukocytosis, no anemia.  Unremarkable be met Patient presents today follow-up. Denies history of sexual intercourse.  He states he has had 3 months of painful defecation, getting worse. He is having a BM every 2-3 days with the miralax 17grams  daily.  He states one time he had one episode of BRB in the toliet prior to the ER visit. He does have urgency with BM.  Feels something pertruding out from his rectum.  He has a lot of gas, feels he has to push to get the stool out.  He states he can not sit down for lay down for 5-6 hours after a BM, states he was given an ABX, augmentin for 7 days, after the ER and this helped decrease the pain from hours to 2 hours afterwards.  Has tried stool softners without help.  Denies fever, chills but states during a BM will feel swelling in his feet and feel feverish.  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   He  reports that he has quit smoking. He has quit using smokeless tobacco. He reports that he does not drink alcohol and does not use drugs.  Current Medications:   Current Outpatient Medications (Endocrine & Metabolic):    empagliflozin (JARDIANCE) 25 MG TABS tablet, Take 25 mg by mouth daily.   insulin glargine, 1 Unit Dial, (TOUJEO SOLOSTAR) 300 UNIT/ML Solostar Pen,    metFORMIN (GLUCOPHAGE) 1000 MG tablet, Take 1,000 mg by mouth 2 (two) times daily.   metFORMIN (GLUCOPHAGE) 1000 MG tablet, Take 1 tablet by mouth 2 (two) times daily.  Current Outpatient Medications (Cardiovascular):    lisinopril (PRINIVIL,ZESTRIL) 5 MG tablet, Take 5 mg by mouth daily.   lisinopril (ZESTRIL) 5 MG tablet, Take 1 tablet by mouth daily.   simvastatin (ZOCOR) 40 MG tablet, Take 40 mg by mouth daily.   simvastatin (ZOCOR) 40 MG tablet, Take 1 tablet by mouth at bedtime.  Current Outpatient Medications (Respiratory):    albuterol (VENTOLIN HFA) 108 (90 Base) MCG/ACT inhaler, Inhale into the lungs. daily  Current Outpatient Medications (Analgesics):    aspirin 81 MG EC tablet,    HYDROcodone-acetaminophen (NORCO/VICODIN) 5-325 MG tablet, Take 1 tablet by mouth every 6 (six) hours as needed.   traMADol (ULTRAM) 50 MG tablet, Take 50 mg by mouth 2 (two) times daily as needed. As needed   Current  Outpatient Medications (Other):    ANUCORT-HC 25 MG suppository, Place 25 mg rectally daily as needed. As needed   Blood Glucose Monitoring Suppl (ONE TOUCH ULTRA 2) w/Device KIT,    dabrafenib mesylate (TAFINLAR) 75 MG capsule, Take 2 capsules (150 mg total) by mouth 2 (two) times daily. Take on an empty stomach 1 hour before or 2 hours after meals.   Docusate Sodium (DSS) 100 MG CAPS, daily   doxycycline (VIBRA-TABS) 100 MG tablet, Take 1 tablet (100 mg total) by mouth 2 (two) times daily.   ergocalciferol (VITAMIN D2) 1.25 MG (50000 UT) capsule, Take 1 capsule by mouth once a week.   hydrOXYzine (ATARAX/VISTARIL) 50 MG tablet, Take 50 mg by mouth 3 (three) times daily as needed.   NON FORMULARY, Diltiazem 2%/Lidocaine5% compound Use 3 x rectally daily for 2 months to heal anal fissure   ONE TOUCH CLUB LANCETS MISC,    polyethylene glycol powder (GLYCOLAX/MIRALAX) 17 GM/SCOOP powder, Take 17 g by mouth daily.   prochlorperazine (COMPAZINE) 10 MG tablet, Take 1 tablet (10 mg total) by mouth every 6 (six) hours as needed for nausea  or vomiting.   trametinib dimethyl sulfoxide (MEKINIST) 2 MG tablet, Take 1 tablet (2 mg total) by mouth daily. Take 1 hour before or 2 hours after a meal. Store refrigerated in original container.   triamcinolone ointment (KENALOG) 0.5 %, Apply 1 application topically 2 (two) times daily.   triamcinolone ointment (KENALOG) 0.5 %, Apply topically. daily  Medical History:  Past Medical History:  Diagnosis Date   Back pain    Colon polyps    Diabetes ([place])    Diabetes mellitus without complication ([place])    Headache    Hyperlipidemia    Hypertension    melanoma 10/2020   left axilla   Neck pain    Allergies: No Known Allergies   Surgical History:  He  has a past surgical history that includes no past surgery and Melanoma removal  (Left). Family History:  His family history includes Diabetes in his mother; Parkinson's disease in his father.  REVIEW OF  SYSTEMS  : All other systems reviewed and negative except where noted in the History of Present Illness.  PHYSICAL EXAM: BP 124/70   Pulse 77   Ht 6' 2"  (1.88 m)   Wt 196 lb 8 oz (89.1 kg)   BMI 25.23 kg/m  General:   Pleasant, well developed male in no acute distress Head:   Normocephalic and atraumatic. Eyes:  sclerae anicteric,conjunctive pink  Heart:   regular rate and rhythm Pulm:  Clear anteriorly; no wheezing Abdomen:   Soft, Obese AB, Sluggish bowel sounds. No tenderness . , No organomegaly appreciated. Rectal: Patient with large anal skin tag versus possible condyloma with some cauliflower appearance , attempted internal exam however patient exquisitely tender, possible stenosis, negative Hemoccult from limited exam. Extremities: Left upper extremity with lymphedema, bilateral lower extremities without swelling. Msk: Symmetrical without gross deformities. Peripheral pulses intact.  Neurologic:  Alert and  oriented x4;  No focal deficits.  Skin:   Dry and intact without significant lesions or rashes. Psychiatric:  Cooperative. Normal mood and affect.    Vladimir Crofts, PA-C 12:06 PM

## 2022-08-07 ENCOUNTER — Encounter: Payer: Self-pay | Admitting: Gastroenterology

## 2022-08-07 ENCOUNTER — Encounter: Payer: Self-pay | Admitting: Physician Assistant

## 2022-08-07 ENCOUNTER — Ambulatory Visit (INDEPENDENT_AMBULATORY_CARE_PROVIDER_SITE_OTHER): Payer: 59 | Admitting: Physician Assistant

## 2022-08-07 VITALS — BP 124/70 | HR 77 | Ht 74.0 in | Wt 196.5 lb

## 2022-08-07 DIAGNOSIS — K6289 Other specified diseases of anus and rectum: Secondary | ICD-10-CM | POA: Diagnosis not present

## 2022-08-07 DIAGNOSIS — Z794 Long term (current) use of insulin: Secondary | ICD-10-CM | POA: Diagnosis not present

## 2022-08-07 DIAGNOSIS — C4359 Malignant melanoma of other part of trunk: Secondary | ICD-10-CM

## 2022-08-07 DIAGNOSIS — Z86010 Personal history of colonic polyps: Secondary | ICD-10-CM

## 2022-08-07 MED ORDER — DOXYCYCLINE HYCLATE 100 MG PO TABS: 100.0000 mg | ORAL_TABLET | Freq: Two times a day (BID) | ORAL | 0 refills | Status: AC

## 2022-08-07 MED ORDER — NON FORMULARY
1 refills | Status: AC
Start: 2022-08-07 — End: ?

## 2022-08-07 NOTE — Progress Notes (Signed)
Agree with assessment and plan as outlined.  

## 2022-08-07 NOTE — Patient Instructions (Addendum)
   _______________________________________________________  If you are age 56 or older, your body mass index should be between 23-30. Your Body mass index is 25.23 kg/m. If this is out of the aforementioned range listed, please consider follow up with your Primary Care Provider.  If you are age 56 or younger, your body mass index should be between 19-25. Your Body mass index is 25.23 kg/m. If this is out of the aformentioned range listed, please consider follow up with your Primary Care Provider.   ________________________________________________________  The [facility] providers would like to encourage you to use [facility] to communicate with providers for non-urgent requests or questions.  Due to long hold times on the telephone, sending your provider a message by [facility] may be a faster and more efficient way to get a response.  Please allow 48 business hours for a response.  Please remember that this is for non-urgent requests.  _______________________________________________________  We have given you samples of the following medication to take: Plenvu  You have been scheduled for a colonoscopy. Please follow written instructions given to you at your visit today.  Please pick up your prep supplies at the pharmacy within the next 1-3 days. If you use inhalers (even only as needed), please bring them with you on the day of your procedure.  Miralax is an osmotic laxative.  It only brings more water into the stool.  This is safe to take daily.  Can take up to 17 gram of miralax twice a day.  Mix with juice or coffee.  Start 1 capful at night for 3-4 days and reassess your response in 3-4 days.  You can increase and decrease the dose based on your response.  Remember, it can take up to 3-4 days to take effect OR for the effects to wear off.   I often pair this with benefiber in the morning to help assure the stool is not too loose.     Diltiazem/lidocaine 3 x daily for 2 months sent  to compound pharmacy   Sent this medication to a compound pharmacy:  [facility], [place], [place] [address]  [phone]  Please DO NOT go directly from our office to pick up this medication! Give the pharmacy 1 day to process the prescription. Extra time is required for them to compound your medication.  Follow up should symptoms persist for secondary evaluation and possible surgical referral for repair.  It was a pleasure to see you today!  Thank you for trusting me with your gastrointestinal care!

## 2022-08-08 ENCOUNTER — Encounter: Payer: Self-pay | Admitting: Gastroenterology

## 2022-08-08 ENCOUNTER — Ambulatory Visit (AMBULATORY_SURGERY_CENTER): Payer: 59 | Admitting: Gastroenterology

## 2022-08-08 ENCOUNTER — Telehealth: Payer: Self-pay

## 2022-08-08 VITALS — BP 153/81 | HR 62 | Temp 98.2°F | Resp 12 | Ht 74.0 in | Wt 196.0 lb

## 2022-08-08 DIAGNOSIS — K649 Unspecified hemorrhoids: Secondary | ICD-10-CM | POA: Diagnosis not present

## 2022-08-08 DIAGNOSIS — K602 Anal fissure, unspecified: Secondary | ICD-10-CM | POA: Diagnosis not present

## 2022-08-08 DIAGNOSIS — K6289 Other specified diseases of anus and rectum: Secondary | ICD-10-CM | POA: Diagnosis not present

## 2022-08-08 DIAGNOSIS — K644 Residual hemorrhoidal skin tags: Secondary | ICD-10-CM

## 2022-08-08 DIAGNOSIS — R933 Abnormal findings on diagnostic imaging of other parts of digestive tract: Secondary | ICD-10-CM | POA: Diagnosis not present

## 2022-08-08 DIAGNOSIS — Z86010 Personal history of colonic polyps: Secondary | ICD-10-CM

## 2022-08-08 MED ORDER — SODIUM CHLORIDE 0.9 % IV SOLN: 500.0000 mL | Freq: Once | INTRAVENOUS | Status: DC

## 2022-08-08 NOTE — Telephone Encounter (Signed)
Referral, records, patient's insurance, and demographic information faxed to [facility] at [phone].

## 2022-08-08 NOTE — Patient Instructions (Addendum)
Handout on hemorrhoids given.  Start lidocaine / diltiazem ointment-pea sized amout TID (3 times a day) for treatment of fissure as previously recommended. (Per Dr Havery Moros, this was ordered yesterday and patient has it at home.)  Additional Recticare / desitin PRN (as needed) for pain over the counter.   Miralax daily to keep stools loose.  Colorectal surgery consult given anal canal stenosis and size of fissure.    YOU HAD AN ENDOSCOPIC PROCEDURE TODAY AT [facility]:   Refer to the procedure report that was given to you for any specific questions about what was found during the examination.  If the procedure report does not answer your questions, please call your gastroenterologist to clarify.  If you requested that your care partner not be given the details of your procedure findings, then the procedure report has been included in a sealed envelope for you to review at your convenience later.  YOU SHOULD EXPECT: Some feelings of bloating in the abdomen. Passage of more gas than usual.  Walking can help get rid of the air that was put into your GI tract during the procedure and reduce the bloating. If you had a lower endoscopy (such as a colonoscopy or flexible sigmoidoscopy) you may notice spotting of blood in your stool or on the toilet paper. If you underwent a bowel prep for your procedure, you may not have a normal bowel movement for a few days.  Please Note:  You might notice some irritation and congestion in your nose or some drainage.  This is from the oxygen used during your procedure.  There is no need for concern and it should clear up in a day or so.  SYMPTOMS TO REPORT IMMEDIATELY:  Following lower endoscopy (colonoscopy or flexible sigmoidoscopy):  Excessive amounts of blood in the stool  Significant tenderness or worsening of abdominal pains  Swelling of the abdomen that is new, acute  Fever of 100F or higher  For urgent or emergent issues, a  gastroenterologist can be reached at any hour by calling [phone]. Do not use MyChart messaging for urgent concerns.    DIET:  We do recommend a small meal at first, but then you may proceed to your regular diet.  Drink plenty of fluids but you should avoid alcoholic beverages for 24 hours.  ACTIVITY:  You should plan to take it easy for the rest of today and you should NOT DRIVE or use heavy machinery until tomorrow (because of the sedation medicines used during the test).    FOLLOW UP: Our staff will call the number listed on your records the next business day following your procedure.  We will call around 7:15- 8:00 am to check on you and address any questions or concerns that you may have regarding the information given to you following your procedure. If we do not reach you, we will leave a message.     If any biopsies were taken you will be contacted by phone or by letter within the next 1-3 weeks.  Please call us at [phone] if you have not heard about the biopsies in 3 weeks.    SIGNATURES/CONFIDENTIALITY: You and/or your care partner have signed paperwork which will be entered into your electronic medical record.  These signatures attest to the fact that that the information above on your After Visit Summary has been reviewed and is understood.  Full responsibility of the confidentiality of this discharge information lies with you and/or your care-partner.

## 2022-08-08 NOTE — Progress Notes (Signed)
History and Physical Interval Note: No interval changes since clinic visit yesterday. Considerable rectal pain with bowel movements ongoing a few months, unable to evaluate in the office (could not tolerate DRE). History of melanoma, recent CT suggests question of mild inflammatory changes of the left colon. Colonoscopy to further evaluate, clarify source of pain and CT findings, he also has a history of small polyp removed a while ago.     08/08/2022 1:32 PM  Ryan Chandler  has presented today for endoscopic procedure(s), with the diagnosis of  Encounter Diagnoses  Name Primary?   Rectal pain Yes   History of adenomatous polyp of colon   .  The various methods of evaluation and treatment have been discussed with the patient and/or family. After consideration of risks, benefits and other options for treatment, the patient has consented to  the endoscopic procedure(s).   The patient's history has been reviewed, patient examined, no change in status, stable for surgery.  I have reviewed the patient's chart and labs.  Questions were answered to the patient's satisfaction.    Jolly Mango, MD [facility]

## 2022-08-08 NOTE — Progress Notes (Signed)
Called to room to assist during endoscopic procedure.  Patient ID and intended procedure confirmed with present staff. Received instructions for my participation in the procedure from the performing physician.  

## 2022-08-08 NOTE — Progress Notes (Signed)
____________________________________________________________  Attending physician addendum:  Estill Bamberg, Thank you for sending this case to me. I have reviewed the entire note and agree with the plan.  Grateful to Dr. Havery Moros for his availability. I will speak with him about it later today.  Wilfrid Lund, MD  ____________________________________________________________

## 2022-08-08 NOTE — Progress Notes (Signed)
Sedate, gd SR, tolerated procedure well, VSS, report to RN 

## 2022-08-08 NOTE — Telephone Encounter (Signed)
-----   Message from Yetta Flock, MD sent at 08/08/2022  2:32 PM EDT ----- Regarding: referral to surgery [facility] can you please refer this patient to [facility] colorectal surgery for anal canal stenosis / anal fissure? Thanks

## 2022-08-08 NOTE — Op Note (Signed)
[facility] Patient Name: Ryan Chandler Procedure Date: 08/08/2022 1:31 PM MRN: [id number] Endoscopist: Remo Lipps P. Havery Moros , MD Age: 56 Referring MD:  Date of Birth: 03-10-1966 Gender: Male Account #: [id number] Procedure:                Colonoscopy Indications:              Abnormal CT of the GI tract (left sided                            thickening), Rectal pain ongoing for months -                            unable to to do DRE in office due to pain Medicines:                Monitored Anesthesia Care Procedure:                Pre-Anesthesia Assessment:                           - Prior to the procedure, a History and Physical                            was performed, and patient medications and                            allergies were reviewed. The patient's tolerance of                            previous anesthesia was also reviewed. The risks                            and benefits of the procedure and the sedation                            options and risks were discussed with the patient.                            All questions were answered, and informed consent                            was obtained. Prior Anticoagulants: The patient has                            taken no previous anticoagulant or antiplatelet                            agents. ASA Grade Assessment: III - A patient with                            severe systemic disease. After reviewing the risks                            and benefits, the patient was deemed in  satisfactory condition to undergo the procedure.                           After obtaining informed consent, the colonoscope                            was passed under direct vision. Throughout the                            procedure, the patient's blood pressure, pulse, and                            oxygen saturations were monitored continuously. The                            CF [id number] #[id number] was  introduced through the anus                            and advanced to the. The [id number] Slim SB                            Colonoscope was introduced through the anus and                            advanced to the the terminal ileum, with                            identification of the appendiceal orifice and IC                            valve. The colonoscopy was performed without                            difficulty. The patient tolerated the procedure                            well. The quality of the bowel preparation was                            good. The terminal ileum, ileocecal valve,                            appendiceal orifice, and rectum were photographed. Scope In: 1:48:20 PM Scope Out: 2:09:21 PM Scope Withdrawal Time: 0 hours 16 minutes 6 seconds  Total Procedure Duration: 0 hours 21 minutes 1 second  Findings:                 A large anal fissure was found on perianal exam.                            There was an anal canal stenosis which prohibited                            DRE with index finger,  I could traverse it with 5th                            digit which seemed to dilate it. No mass lesions                            noted. Skin tags noted. Regular adult colonoscope                            could not traverse the anal canal stenosis, a                            pediatric ultraslim scope was used to traverse it                            and complete the exam.                           The terminal ileum appeared normal.                           Anal papilla(e) were hypertrophied. Biopsies were                            taken with a cold forceps for histology to rule out                            AIN.                           Internal hemorrhoids were found during retroflexion.                           The exam was otherwise without abnormality. No                            polyps. There was looping of the colon while using                             the ultraslim scope. No inflammatory changes - no                            obvious Crohn's disease Complications:            No immediate complications. Estimated blood loss:                            Minimal. Estimated Blood Loss:     Estimated blood loss was minimal. Impression:               - Perianal skin tags                           - Large anal fissure found on perianal exam.                           -  Anal canal stenosis                           - The examined portion of the ileum was normal.                           - Anal papilla(e) were hypertrophied. Biopsied to                            rule out AIN.                           - Internal hemorrhoids.                           - The examination was otherwise normal. Recommendation:           - Patient has a contact number available for                            emergencies. The signs and symptoms of potential                            delayed complications were discussed with the                            patient. Return to normal activities tomorrow.                            Written discharge instructions were provided to the                            patient.                           - Resume previous diet.                           - Continue present medications.                           - Start lidocaine / diltiazem ointment - pea sized                            amount TID for treatment of fissure as previously                            recommended                           - Additional Recticare / desitin PRN for pain OTC                           - Miralax daily to keep stools loose                           - Colorectal surgery consult given  anal canal                            stenosis and size of fissure                           - Await pathology results. Remo Lipps P. Havery Moros, MD 08/08/2022 2:25:51 PM This report has been signed electronically.

## 2022-08-09 ENCOUNTER — Telehealth: Payer: Self-pay | Admitting: *Deleted

## 2022-08-09 NOTE — Telephone Encounter (Signed)
  Follow up Call-     08/08/2022   12:56 PM  Call back number  Post procedure Call Back phone  # [phone]  Permission to leave phone message Yes     Patient questions:  Do you have a fever, pain , or abdominal swelling? No. Pain Score  0 *  Have you tolerated food without any problems? Yes.    Have you been able to return to your normal activities? Yes.    Do you have any questions about your discharge instructions: Diet   No. Medications  No. Follow up visit  No.  Do you have questions or concerns about your Care? No.  Actions: * If pain score is 4 or above: No action needed, pain <4.

## 2022-09-06 ENCOUNTER — Encounter: Payer: Self-pay | Admitting: Oncology

## 2022-09-08 NOTE — Telephone Encounter (Signed)
We don't release medical records at this facility. Records are released through [facility] Management Office.  He can call [phone] Health Information Management and request records for himself. [place] can fax the information and records request to [phone].He can also request records to be sent to him via MyChart

## 2022-09-13 ENCOUNTER — Telehealth: Payer: Self-pay | Admitting: *Deleted

## 2022-09-13 NOTE — Telephone Encounter (Signed)
09/12/2022 Late entry: [place] called this forms nurse to speak with Maretta Los.  "I need help.  Have been terminated from my job and trying to maintain disability.  [facility] has requested records but were informed I do not have any, but I do." Maretta Los agreed to wait in lobby to speak with this nurse as another patient currently awaiting this nurse to assist with forms in treatment area.  Reviewed patient files and H.I.M. releases.   Connected with Otila Kluver of ([place]) H.I.M. Maretta Los authorized this nurse placing call to H.I.M. on speaker.   Per [facility] ([place]) Medical records, "the [place] request for records reads [facility], patient has not received care at [facility] during 12/24/2021 to present is reason letter reads 'No records'.  Needs to be addressed correctly to receive [facility] information which requires a new ROI if name is changed."  This nurse left message for [facility] regarding claim no.#[id number] of invalid request advising to use legal name "[place]".   Maretta Los received e-mail at this time requesting records from [place] he forwarded to [email or web address].  This nurse faxed e-mail with letter brought to [facility] from [facility] with newly signed [facility] authorization for release to ([place]) H.I.M. [phone].  Demonstrated [place] Records Menu areas for him to try to obtain records to e-mail, receive a one-time share code or [facility] folder to download to a USB drive when Maretta Los said "this process still may take too long". Currently denies further questions or needs.  Provided name and direct extension to call in future as needed.

## 2022-10-23 ENCOUNTER — Telehealth: Payer: Self-pay | Admitting: Oncology

## 2022-10-23 NOTE — Telephone Encounter (Signed)
Called patient per dr. [name] transition. Left voicemail for patient to call us back to explain.

## 2022-10-23 NOTE — Telephone Encounter (Signed)
Called patient back per dr. [name] transition. Patient r/s with new provider.

## 2022-10-25 ENCOUNTER — Telehealth: Payer: Self-pay | Admitting: *Deleted

## 2022-10-25 ENCOUNTER — Other Ambulatory Visit: Payer: Self-pay

## 2022-10-25 NOTE — Telephone Encounter (Signed)
Ryan Chandler has filed for disability and the company (Dr Simeon Craft) is requesting a peer to peer to discuss. Phone # [phone]

## 2022-10-25 NOTE — Telephone Encounter (Signed)
Voicemail received "Ryan Chandler [birth year] has requested disability.  We are requesting a return call today to [phone] for peer to peer with Dr. Jhonnie Garner from 9am to 5:00 pm."  Message returned to collaborative for provider to complete request.  Peer to peer not performed by forms nursing staff.

## 2022-11-30 ENCOUNTER — Other Ambulatory Visit: Payer: Self-pay

## 2022-12-08 ENCOUNTER — Telehealth: Payer: Self-pay | Admitting: Internal Medicine

## 2022-12-08 NOTE — Telephone Encounter (Signed)
Called patient regarding upcoming February appointments, patient is notified.

## 2022-12-28 ENCOUNTER — Ambulatory Visit: Payer: 59 | Admitting: Internal Medicine

## 2022-12-28 ENCOUNTER — Ambulatory Visit: Payer: 59 | Admitting: Oncology

## 2022-12-30 IMAGING — CT CT VENOGRAM CHEST
2 of 6 series · 15 of 46 positions shown, 17 images · IV contrast (OMNIPAQUE)
Comparison: Comparison made with November 18, 2020.

CLINICAL DATA: Skin cancer, assess treatment response in a
55-year-old male. Currently on immunotherapy.

EXAM:
CT CHEST WITH CONTRAST
TECHNIQUE: Multidetector CT imaging of the chest was performed during
intravenous contrast administration. Study acquired as a venogram
with maximum intensity projection images of the chest also submitted
in this patient with reported lymphedema
CONTRAST:  100mL OMNIPAQUE IOHEXOL 350 MG/ML SOLN

[Series 2: axial venous · axial · portal-venous · 0.86mm/px · z∈[+1384,+1688]mm · 12 of 174 slices shown, 14 images]
[im 11/174  soft-tissue]
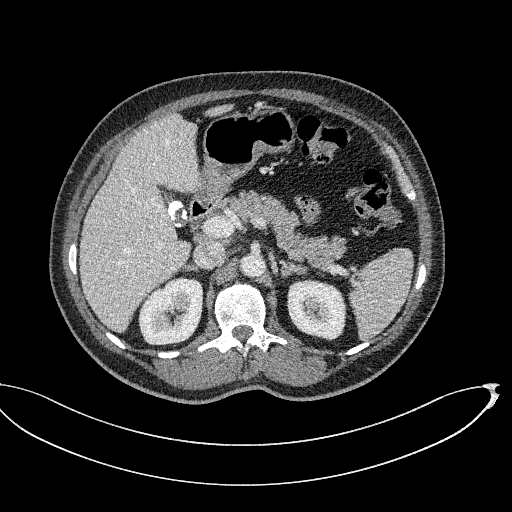
[im 11/174  bone]
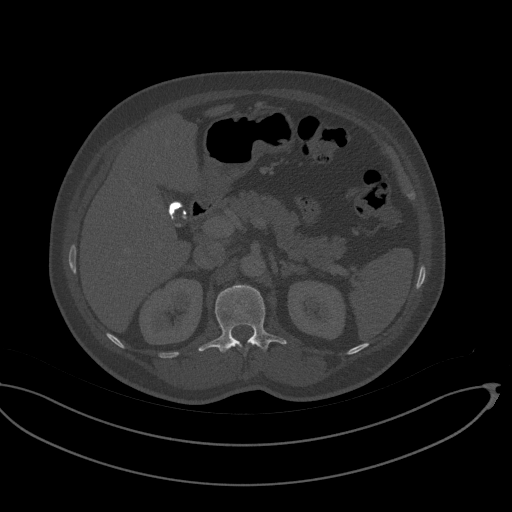
[im 22/174  soft-tissue]
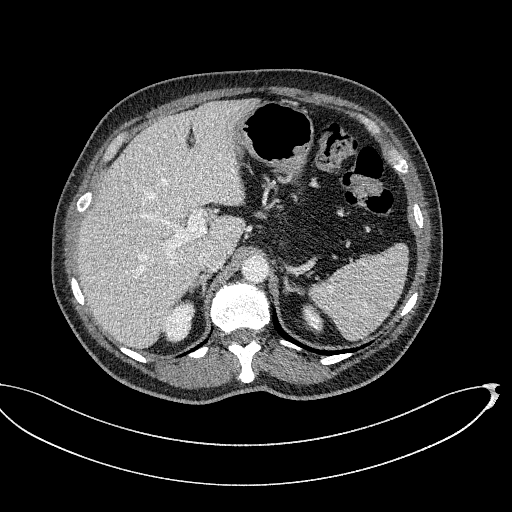
[im 44/174  soft-tissue]
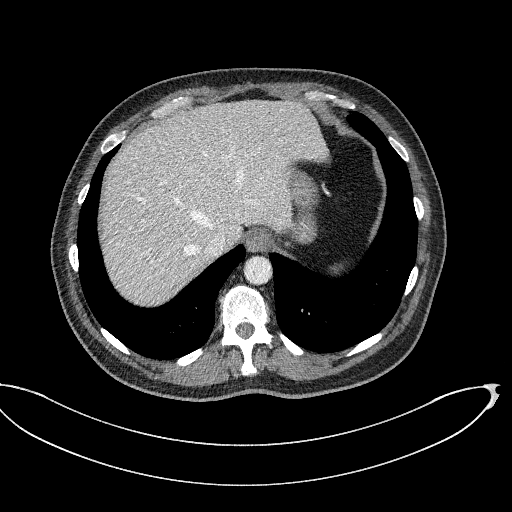
[im 55/174  soft-tissue]
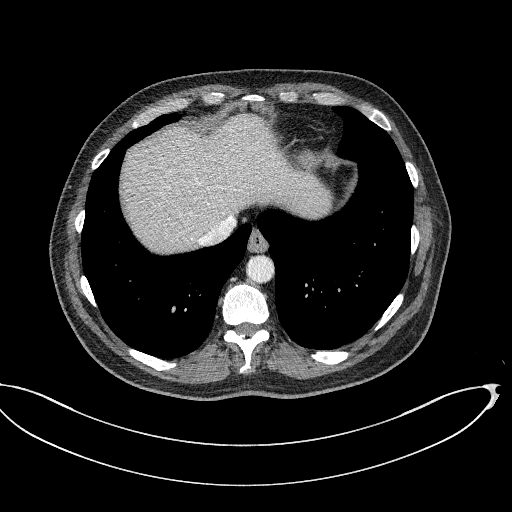
[im 65/174  soft-tissue]
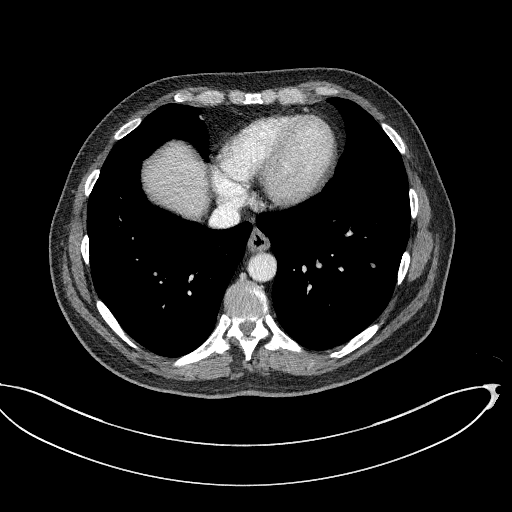
[im 76/174  soft-tissue]
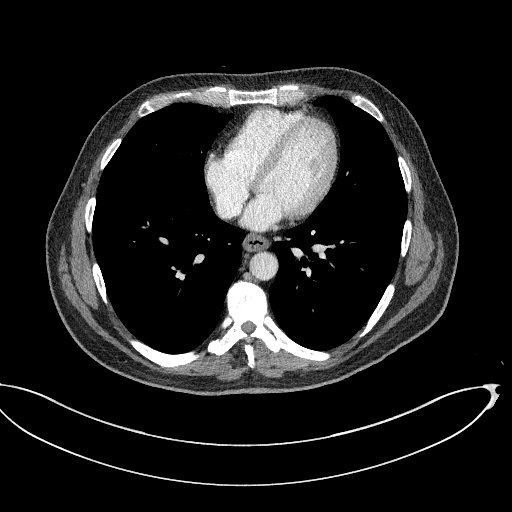
[im 98/174  soft-tissue]
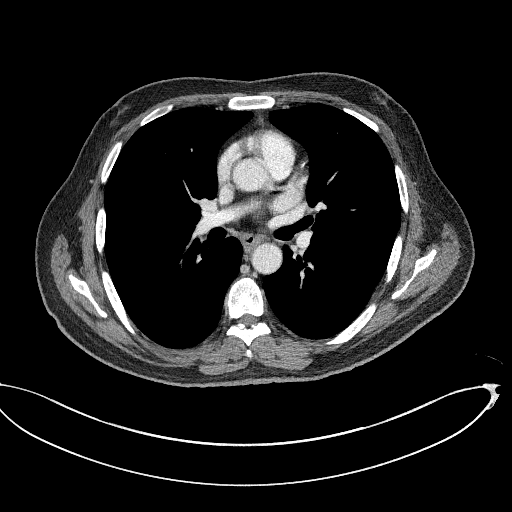
[im 109/174  soft-tissue]
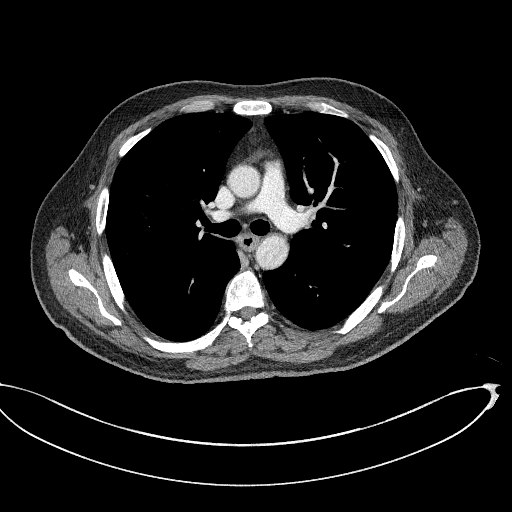
[im 119/174  soft-tissue]
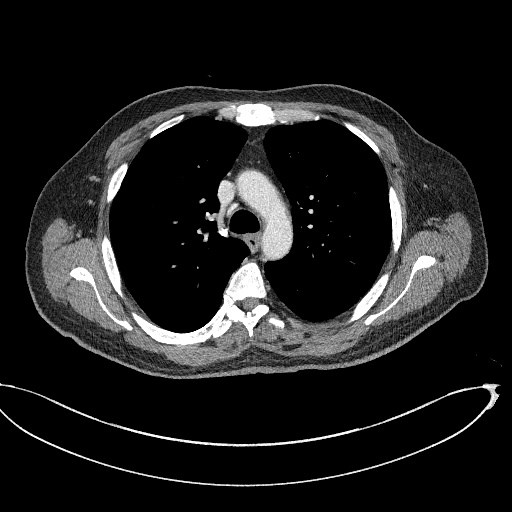
[im 119/174  bone]
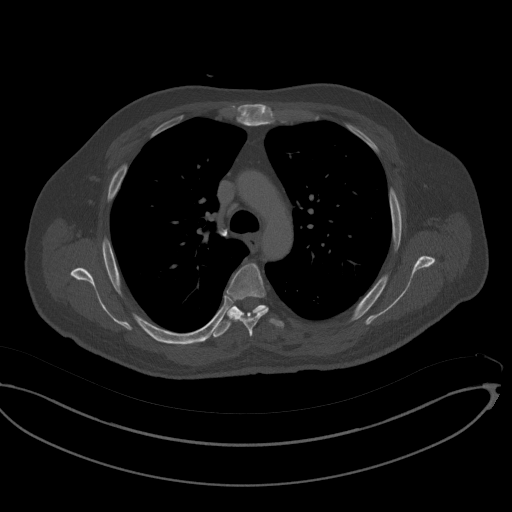
[im 130/174  soft-tissue]
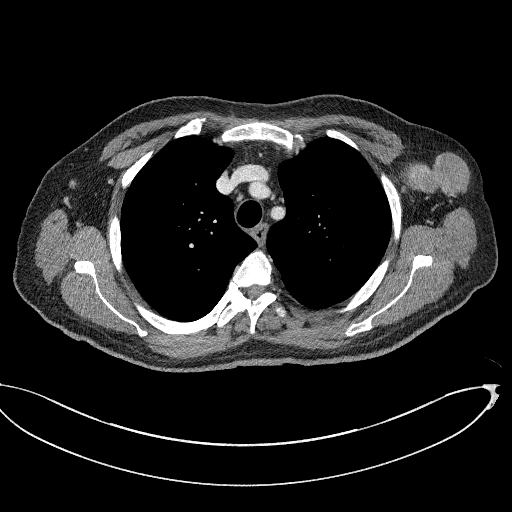
[im 152/174  soft-tissue]
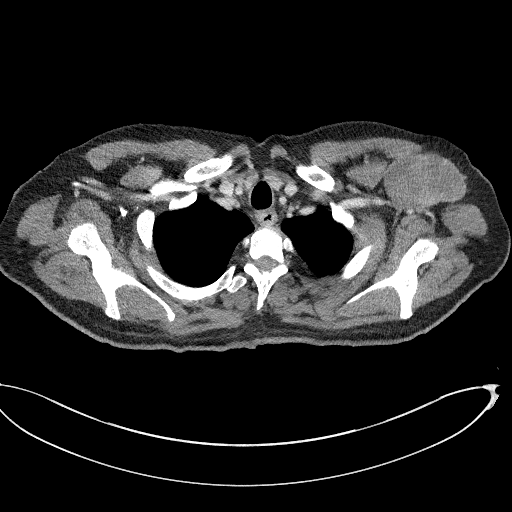
[im 163/174  soft-tissue]
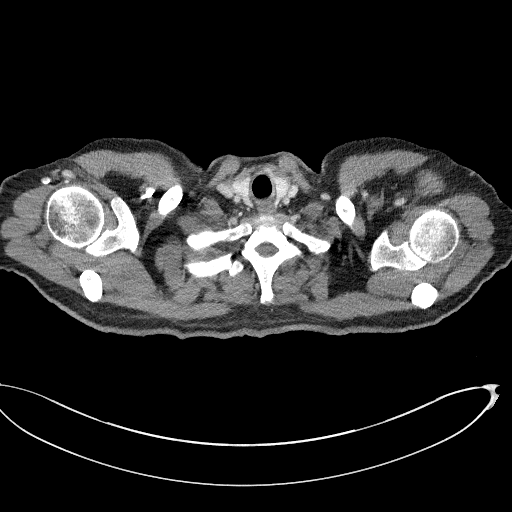

[Series 5: coronal soft · coronal · 0.72mm/px · 3 of 146 slices shown]
[im 37/146  soft-tissue]
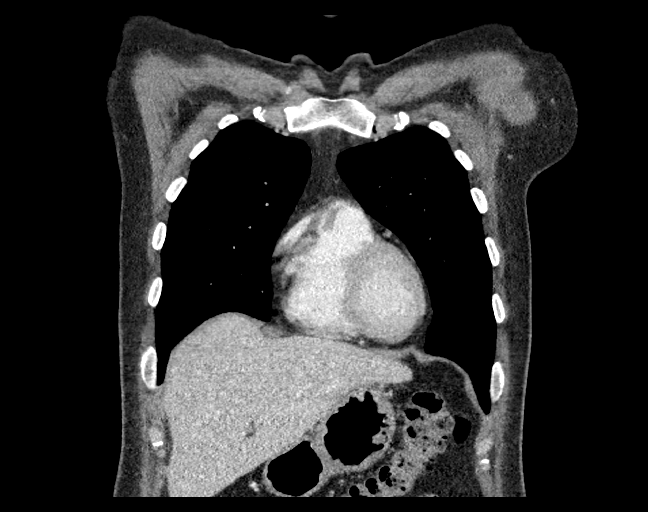
[im 73/146  soft-tissue]
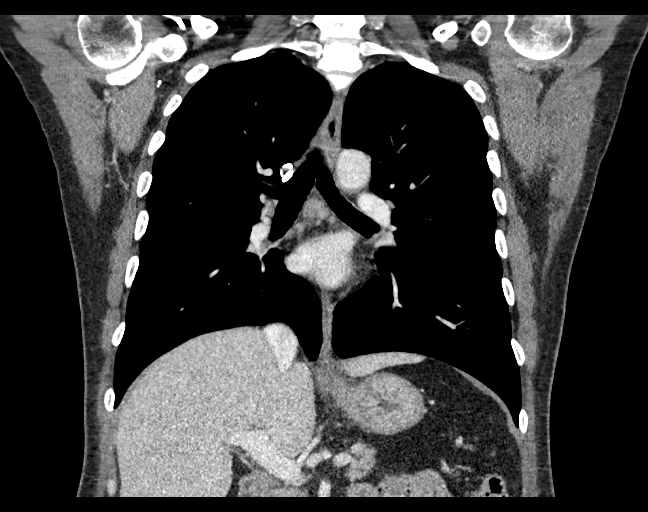
[im 109/146  soft-tissue]
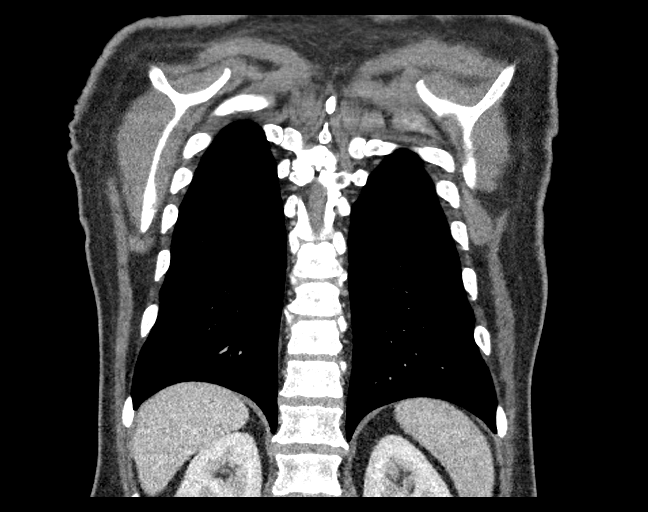

[15 of 46 positions shown; findings below may reference images not displayed]

FINDINGS: Cardiovascular: Normal aortic caliber. Minimal calcified and
noncalcified plaque in the thoracic aorta. Normal heart size. No
pericardial effusion.

Central pulmonary vasculatures unremarkable on venous phase
assessment.

Bilateral internal jugular veins, brachiocephalic veins and SVC are
widely patent. There is some mass effect upon veins in the LEFT
upper arm due to bulky LEFT axillary lymphadenopathy. Veins within
the arm are not well assessed. IVC is patent. Hepatic veins are
patent.

Mediastinum/Nodes: No signs of mediastinal lymphadenopathy. Bulky
LEFT axillary lymphadenopathy. (Image [DATE]) dominant lymph node
measuring approximately 6.8 x 4.4 cm compared to 6.4 x 4.8 cm.

Next largest lymph node measuring 2.4 cm short axis dimension,
previously 1.4 cm (image 39/2) no RIGHT axillary lymphadenopathy. No
thoracic inlet lymphadenopathy. No internal mammary lymphadenopathy.
No hilar lymphadenopathy or mediastinal lymphadenopathy.

Diffuse mild thickening of the esophagus.

Lungs/Pleura: Small LEFT lower lobe pulmonary nodule (image 110/4) 6
mm, stable dating back to Friday June, 2019. Mild pulmonary emphysema.
Airways are patent. No pleural effusion.

Upper Abdomen: Cholelithiasis. Probable background hepatic
steatosis. Visualized portal vein is patent. Visualized pancreas,
spleen, adrenal glands and kidneys are unremarkable.

Musculoskeletal: No acute musculoskeletal process. Spinal
degenerative changes.
IMPRESSION: 1. Bulky LEFT axillary lymphadenopathy, slightly increased in size
from the prior study.
2. There is some mass effect upon veins in the LEFT upper arm due to
bulky LEFT axillary lymphadenopathy. Veins within the LEFT arm are
not well assessed. Central venous structures in the chest are
patent.
3. Stable 6 mm LEFT lower lobe pulmonary nodule.  Stable since [DATE]. Cholelithiasis.
5. Probable background hepatic steatosis.
6. Emphysema and aortic atherosclerosis.

Aortic Atherosclerosis (XMAOR-5KX.X) and Emphysema (XMAOR-NX6.5).

## 2023-01-02 ENCOUNTER — Telehealth: Payer: Self-pay | Admitting: Internal Medicine

## 2023-01-02 NOTE — Telephone Encounter (Signed)
Called patient regarding upcoming appointments. Patient is notified.

## 2023-01-08 ENCOUNTER — Ambulatory Visit: Payer: 59 | Admitting: Internal Medicine

## 2023-01-12 NOTE — Progress Notes (Unsigned)
[place] OFFICE PROGRESS NOTE  [name], MD [place] [address]  DIAGNOSIS: Melanoma of unknown primary diagnosed in 2022.  He was found to have stage III presented with bulky left axilla lymphadenopathy.   PRIOR THERAPY: 1) He is status post biopsy with ultrasound guidance completed in January 2022 confirmed the presence of melanoma.   2) Ipilimumab 1 mg/kg and nivolumab 3 mg/kg neoadjuvant treatment started on January 14, 2021.  He completed 4 cycles of therapy with stable disease    3) He is status post surgical resection and lymph node dissection completed on August 22, 2021 at [facility].  The final pathology showed 1 out of 9 lymph nodes involved with malignancy.   4) Tafinlar '75mg'$  capsules, 2 capsules ('150mg'$ ) by mouth twice daily with  Mekinist 2 mg tablets,1 tablet by mouth once daily started on April 27, 2021.  Therapy discontinued in October 2022.   5) Radiation therapy to the left axilla completed in December 2022.  He received 48 Gray in 20 fractions.  CURRENT THERAPY: Observation   INTERVAL HISTORY: Ryan Chandler 57 y.o. male returns to the clinic for a 13-monthfollow-up visit.  The patient is followed in clinic for his history of melanoma. The patient was last seen by Dr. SAlen Blewon 06/27/2022.  Dr. SAlen Blewhas left the practice and the patient's care will be taken over by Dr. MJulien Nordmannand myself.  It appears his last PET scan was February 2023.  [facility]  Since last being seen the patient denies any major changes in his health he does mention he is still fatigued.  He sometimes struggles with lymphedema in his left upper extremity for which he uses his compression sleeve.  Denies any recent fever, chills, night sweats, or unexplained weight loss.  Denies any new skin lesions.  He does not have a dermatologist for routine skin checks.  Denies any chest pain,  shortness of breath, cough, or hemoptysis.  He does have some swelling in his armpit from his lymphedema and he is unsure if this is related to adenopathy or if it is enlarging or not.  Denies any appetite changes.  He completed his PT and OT for his lymphedema.  He is here today for evaluation repeat blood work   MEDICAL HISTORY: Past Medical History:  Diagnosis Date   Back pain    Colon polyps    Diabetes ([place]    Diabetes mellitus without complication ([facility]    Headache    Hyperlipidemia    Hypertension    melanoma 10/2020   left axilla   Neck pain     ALLERGIES:  has No Known Allergies.  MEDICATIONS:  Current Outpatient Medications  Medication Sig Dispense Refill   albuterol (VENTOLIN HFA) 108 (90 Base) MCG/ACT inhaler Inhale into the lungs. daily     ANUCORT-HC 25 MG suppository Place 25 mg rectally daily as needed. As needed     aspirin 81 MG EC tablet      Blood Glucose Monitoring Suppl (ONE TOUCH ULTRA 2) w/Device KIT      Docusate Sodium (DSS) 100 MG CAPS daily     doxycycline (VIBRA-TABS) 100 MG tablet Take 1 tablet (100 mg total) by mouth 2 (two) times daily. 20 tablet 0   empagliflozin (JARDIANCE) 25 MG TABS tablet Take 25 mg by mouth daily.     ergocalciferol (VITAMIN D2) 1.25 MG (50000 UT)  capsule Take 1 capsule by mouth once a week. (Patient not taking: Reported on 08/08/2022)     HYDROcodone-acetaminophen (NORCO/VICODIN) 5-325 MG tablet Take 1 tablet by mouth every 6 (six) hours as needed.     hydrOXYzine (ATARAX/VISTARIL) 50 MG tablet Take 50 mg by mouth 3 (three) times daily as needed.     insulin glargine, 1 Unit Dial, (TOUJEO SOLOSTAR) 300 UNIT/ML Solostar Pen      lisinopril (PRINIVIL,ZESTRIL) 5 MG tablet Take 5 mg by mouth daily.     metFORMIN (GLUCOPHAGE) 1000 MG tablet Take 1,000 mg by mouth 2 (two) times daily.     NON FORMULARY Diltiazem 2%/Lidocaine5% compound Use 3 x rectally daily for 2 months to heal anal fissure 30 g 1   ONE TOUCH CLUB LANCETS MISC       polyethylene glycol powder (GLYCOLAX/MIRALAX) 17 GM/SCOOP powder Take 17 g by mouth daily. 255 g 0   prochlorperazine (COMPAZINE) 10 MG tablet Take 1 tablet (10 mg total) by mouth every 6 (six) hours as needed for nausea or vomiting. 30 tablet 0   simvastatin (ZOCOR) 40 MG tablet Take 40 mg by mouth daily.     traMADol (ULTRAM) 50 MG tablet Take 50 mg by mouth 2 (two) times daily as needed. As needed     No current facility-administered medications for this visit.    SURGICAL HISTORY:  Past Surgical History:  Procedure Laterality Date   COLONOSCOPY     Melanoma removal  Left    no past surgery      REVIEW OF SYSTEMS:   Review of Systems  Constitutional: Positive for fatigue.  Negative for appetite change, chills, fever and unexpected weight change.  HENT: Negative for mouth sores, nosebleeds, sore throat and trouble swallowing.   Eyes: Negative for eye problems and icterus.  Respiratory: Negative for cough, hemoptysis, shortness of breath and wheezing.   Cardiovascular: Negative for chest pain and leg swelling.  Gastrointestinal: Negative for abdominal pain, constipation, diarrhea, nausea and vomiting.  Genitourinary: Negative for bladder incontinence, difficulty urinating, dysuria, frequency and hematuria.   Musculoskeletal: Negative for back pain, gait problem, neck pain and neck stiffness.  Skin: Positive for edema left upper extremity.  Negative for itching and rash.  Neurological: Negative for dizziness, extremity weakness, gait problem, headaches, light-headedness and seizures.  Hematological: Negative for adenopathy. Does not bruise/bleed easily.  Psychiatric/Behavioral: Negative for confusion, depression and sleep disturbance. The patient is not nervous/anxious.     PHYSICAL EXAMINATION:  Blood pressure 137/72, pulse 92, temperature 97.8 F (36.6 C), temperature source Temporal, resp. rate 17, height '6\' 2"'$  (1.88 m), weight 207 lb 3.2 oz (94 kg), SpO2 100 %.  ECOG  PERFORMANCE STATUS: 1  Physical Exam  Constitutional: Oriented to person, place, and time and well-developed, well-nourished, and in no distress.  HENT:  Head: Normocephalic and atraumatic.  Mouth/Throat: Oropharynx is clear and moist. No oropharyngeal exudate.  Eyes: Conjunctivae are normal. Right eye exhibits no discharge. Left eye exhibits no discharge. No scleral icterus.  Neck: Normal range of motion. Neck supple.  Cardiovascular: Normal rate, regular rhythm, normal heart sounds and intact distal pulses.   Pulmonary/Chest: Effort normal and breath sounds normal. No respiratory distress. No wheezes. No rales.  Abdominal: Soft. Bowel sounds are normal. Exhibits no distension and no mass. There is no tenderness.  Musculoskeletal: Normal range of motion.  Mild left upper extremity lymphedema.  The patient is wearing a compression sleeve. Lymphadenopathy:    No cervical adenopathy.  Neurological: Alert  and oriented to person, place, and time. Exhibits normal muscle tone. Gait normal. Coordination normal.  Skin: Skin is warm and dry. No rash noted. Not diaphoretic. No erythema. No pallor.  Psychiatric: Mood, memory and judgment normal.  Vitals reviewed.  LABORATORY DATA: Lab Results  Component Value Date   WBC 5.9 07/19/2022   HGB 14.8 07/19/2022   HCT 45.0 07/19/2022   MCV 87.4 07/19/2022   PLT 233 07/19/2022      Chemistry      Component Value Date/Time   NA 136 07/19/2022 2212   K 3.9 07/19/2022 2212   CL 99 07/19/2022 2212   CO2 29 07/19/2022 2212   BUN 13 07/19/2022 2212   CREATININE 0.77 07/19/2022 2212   CREATININE 0.81 06/27/2022 0900      Component Value Date/Time   CALCIUM 9.3 07/19/2022 2212   ALKPHOS 60 06/27/2022 0900   AST 10 (L) 06/27/2022 0900   ALT 9 06/27/2022 0900   BILITOT 0.5 06/27/2022 0900       RADIOGRAPHIC STUDIES:  No results found.   ASSESSMENT/PLAN:  This is a very pleasant 57 year old male who is followed by the clinic for history  of stage III melanoma.  The patient presented with bulky axillary lymph node involvement.  The patient initially had an ultrasound-guided biopsy on January 2022 which showed the presence of melanoma.  He then underwent 4 cycles of ipilimumab 1 mg/kg and nivolumab 3 mg/kg neoadjuvant treatment which was started on 01/14/2021.  He then underwent surgical resection and lymph node dissection on 08/[facility].  The final pathology showed a 1 out of 9 lymph nodes involved with malignancy.  He then underwent treatment with Tafinlar 150 mg twice daily with Mekinist 2 mg tablets which was started on 04/27/2021 and discontinued on 10/22  He then underwent radiation to left axilla which was completed on 12/22  He has been on observation since that time.  The patient was seen with Dr. Julien Nordmann today.  Labs were reviewed.  Dr. Julien Nordmann recommends a repeat PET scan in approximately 4 months.  Patient is scheduled to see his surgeon at [facility] in approximately 2 weeks.  I discussed with the patient that if they are planning to arrange for repeat scan at that time, just to call us back and would be be happy to cancel her scan if they will do it at [facility].  We will see the patient back for follow-up visit in 4 months after his scan to review the results.  Will also refer the patient to [facility] dermat[facility] to establish care given his history of melanoma.  The patient was advised to call immediately if he has any concerning symptoms in the interval. The patient voices understanding of current disease status and treatment options and is in agreement with the current care plan. All questions were answered. The patient knows to call the clinic with any problems, questions or concerns. We can certainly see the patient much sooner if necessary   Orders Placed This Encounter  Procedures   NM PET Image Restage (PS) Whole Body    Standing Status:   Future    Standing Expiration Date:    01/16/2024    Order Specific Question:   If indicated for the ordered procedure, I authorize the administration of a radiopharmaceutical per Radiology protocol    Answer:   Yes    Order Specific Question:   Preferred imaging location?    Answer:   [facility]  CBC with Differential (Cancer Center Only)    Standing Status:   Future    Standing Expiration Date:   01/17/2024   CMP ([place] only)    Standing Status:   Future    Standing Expiration Date:   01/17/2024   Ambulatory referral to Dermatology    Referral Priority:   Routine    Referral Type:   Consultation    Referral Reason:   Specialty Services Required    Requested Specialty:   Dermatology    Number of Visits Requested:   [place], PA-C 01/16/23   ADDENDUM: Hematology/Oncology Attending:  I had a face-to-face encounter with the patient today.  I reviewed his record, lab and recommended his care plan.  This is a very pleasant 57 years old [place] male with melanoma of unknown primary diagnosed in 2022 when he presented with stage III with bulky left axillary lymphadenopathy.  He is status post ultrasound-guided core biopsy of the axillary lymph node followed by neoadjuvant treatment with ipilimumab and nivolumab for 4 cycles followed by surgical resection and left axillary lymph node dissection completed on August 22, 2021 at [place] with the final pathology showing 1 out of 9 lymph node involved with malignancy.  The patient also received radiotherapy to the left axilla completed in December 2022 and has been on observation since that time.  He has significant lymphedema from his treatment.  He was seen by the lymphedema clinic and after the initial physical therapy he was advised to do it at home with instruction but he continues to have significant lymphedema and hard palpable area in the left axilla likely from the surgical scars.  His last imaging studies were done with CT of the  abdomen and pelvis in September 2023 at [facility].  There was no evidence for disease recurrence on that scan or the previous PET scan performed a year ago. I recommended for the patient to continue on observation with repeat PET scan in 4 months for restaging of his disease. The patient was advised to call immediately if he has any other concerning symptoms in the interval. The total time spent in the appointment was 30 minutes. Disclaimer: This note was dictated with voice recognition software. Similar sounding words can inadvertently be transcribed and may be missed upon review. Eilleen Kempf, MD

## 2023-01-13 LAB — HEMOGLOBIN A1C: Hemoglobin A1C: 11.9

## 2023-01-13 LAB — AMB RESULTS CONSOLE CBG: Glucose: 412

## 2023-01-13 NOTE — Progress Notes (Signed)
Pt is not fasting. Pt is aware of diabetes and will follow up with primary care as soon as possible.

## 2023-01-16 ENCOUNTER — Inpatient Hospital Stay: Payer: BLUE CROSS/BLUE SHIELD | Attending: Physician Assistant | Admitting: Physician Assistant

## 2023-01-16 ENCOUNTER — Other Ambulatory Visit: Payer: Self-pay

## 2023-01-16 VITALS — BP 137/72 | HR 92 | Temp 97.8°F | Resp 17 | Ht 74.0 in | Wt 207.2 lb

## 2023-01-16 DIAGNOSIS — Z85820 Personal history of malignant melanoma of skin: Secondary | ICD-10-CM | POA: Insufficient documentation

## 2023-01-16 DIAGNOSIS — C4359 Malignant melanoma of other part of trunk: Secondary | ICD-10-CM

## 2023-01-16 DIAGNOSIS — Z923 Personal history of irradiation: Secondary | ICD-10-CM | POA: Insufficient documentation

## 2023-01-22 ENCOUNTER — Encounter: Payer: Self-pay | Admitting: *Deleted

## 2023-01-22 NOTE — Progress Notes (Addendum)
Pt attended 01/13/23 screening event where b/p ws 158/86; blood sugar was 412, and A1C was 11.9. Pt states he saw his PCP on 01/19/23 and was given an Rx with a GOOD Rx recommendation to pay for the med but pt went to [facility] and was told he could not use the coupon discount with his insurance. Pt currently states he is not working due to disability. Pt advised to call PCP office today to let them know he was unable to get Rx, his employment status,  and to see if dr had other alternatives for his Rx/Tx. Pt stated he would call PCP. This caller also left message with Dr. Hoover Brunette office about pt's screening results. No additional health equity team support indicated at this time since PCP office aware of pt v/s and SDOH issues. Nurse from PCP office returned call about pt's event results and states she will f/u with pt about med issues - she reviewed current meds in chart and physician notes also.

## 2023-03-05 ENCOUNTER — Encounter: Payer: BLUE CROSS/BLUE SHIELD | Admitting: Dermatology

## 2023-03-05 ENCOUNTER — Telehealth: Payer: Self-pay | Admitting: Medical Oncology

## 2023-03-05 NOTE — Progress Notes (Deleted)
   Follow-Up Visit   Subjective  Ryan Chandler is a 57 y.o. male who presents for the following: Skin Cancer Screening and Full Body Skin Exam - He has history of Melanoma of left axilla. - he had an excision and lymphoscintigraphy 08/22/2021. He was on Tafinlar and Mekinist 04/2021-08/2021. He completed radiation 10/2021. He is here to establish dermatology care.  The patient presents for Total-Body Skin Exam (TBSE) for skin cancer screening and mole check. The patient has spots, moles and lesions to be evaluated, some may be new or changing and the patient has concerns that these could be cancer.    The following portions of the chart were reviewed this encounter and updated as appropriate: medications, allergies, medical history  Review of Systems:  No other skin or systemic complaints except as noted in HPI or Assessment and Plan.  Objective  Well appearing patient in no apparent distress; mood and affect are within normal limits.  A full examination was performed including scalp, head, eyes, ears, nose, lips, neck, chest, axillae, abdomen, back, buttocks, bilateral upper extremities, bilateral lower extremities, hands, feet, fingers, toes, fingernails, and toenails. All findings within normal limits unless otherwise noted below.   Relevant physical exam findings are noted in the Assessment and Plan.    Assessment & Plan   LENTIGINES, SEBORRHEIC KERATOSES, HEMANGIOMAS - Benign normal skin lesions - Benign-appearing - Call for any changes  MELANOCYTIC NEVI - Tan-brown and/or pink-flesh-colored symmetric macules and papules - Benign appearing on exam today - Observation - Call clinic for new or changing moles - Recommend daily use of broad spectrum spf 30+ sunscreen to sun-exposed areas.   ACTINIC DAMAGE - Chronic condition, secondary to cumulative UV/sun exposure - diffuse scaly erythematous macules with underlying dyspigmentation - Recommend daily broad spectrum sunscreen  SPF 30+ to sun-exposed areas, reapply every 2 hours as needed.  - Staying in the shade or wearing long sleeves, sun glasses (UVA+UVB protection) and wide brim hats (4-inch brim around the entire circumference of the hat) are also recommended for sun protection.  - Call for new or changing lesions.  SKIN CANCER SCREENING PERFORMED TODAY.     No follow-ups on file.  ***  Documentation: I have reviewed the above documentation for accuracy and completeness, and I agree with the above.  Langston Reusing, MD

## 2023-03-05 NOTE — Telephone Encounter (Signed)
Insurance denial -Appt at [facility] with  Dr Langston Reusing.. They do not accept his insurance.

## 2023-03-05 NOTE — Progress Notes (Signed)
This encounter was created in error - please disregard. Patient's insurance was out of network.

## 2023-03-07 ENCOUNTER — Other Ambulatory Visit: Payer: Self-pay | Admitting: Physician Assistant

## 2023-03-07 DIAGNOSIS — C439 Malignant melanoma of skin, unspecified: Secondary | ICD-10-CM

## 2023-03-08 NOTE — Progress Notes (Signed)
This nurse received a call from [facility] related to a referral and was informed that the patients insurance is out of network and requires him to use [facility] providers.  This nurse acknowledged understanding.  No further questions or concerns noted at this time.

## 2023-03-12 ENCOUNTER — Other Ambulatory Visit: Payer: Self-pay

## 2023-03-12 DIAGNOSIS — C439 Malignant melanoma of skin, unspecified: Secondary | ICD-10-CM

## 2023-03-12 NOTE — Progress Notes (Signed)
This nurse faxed referral to [facility] in [place].  Phone: [phone] Fax [phone].  No other concerns at this time.

## 2023-05-10 ENCOUNTER — Telehealth: Payer: Self-pay | Admitting: *Deleted

## 2023-05-10 NOTE — Telephone Encounter (Signed)
Pt called with concerns of insurance approval for upcoming PET scan. Called pt and advised to call office with updated insurance due to insurance being expired, per prior authorization team. Awaiting callback from pt with updated insurance information.

## 2023-05-11 ENCOUNTER — Telehealth: Payer: Self-pay | Admitting: Medical Oncology

## 2023-05-11 ENCOUNTER — Other Ambulatory Visit: Payer: Self-pay | Admitting: Medical Oncology

## 2023-05-11 DIAGNOSIS — C4359 Malignant melanoma of other part of trunk: Secondary | ICD-10-CM

## 2023-05-11 NOTE — Telephone Encounter (Signed)
[facility] is out of network for imaging. I told pt ot contact his insurance with approved imaging facility.

## 2023-05-14 ENCOUNTER — Encounter (HOSPITAL_COMMUNITY): Payer: BLUE CROSS/BLUE SHIELD

## 2023-05-14 ENCOUNTER — Inpatient Hospital Stay: Payer: BLUE CROSS/BLUE SHIELD | Attending: Physician Assistant

## 2023-05-16 ENCOUNTER — Inpatient Hospital Stay: Payer: BLUE CROSS/BLUE SHIELD | Admitting: Internal Medicine

## 2023-05-17 ENCOUNTER — Other Ambulatory Visit: Payer: Self-pay | Admitting: Medical Oncology

## 2023-05-17 ENCOUNTER — Telehealth: Payer: Self-pay | Admitting: Medical Oncology

## 2023-05-17 DIAGNOSIS — C4359 Malignant melanoma of other part of trunk: Secondary | ICD-10-CM

## 2023-05-17 NOTE — Telephone Encounter (Signed)
labs

## 2023-05-17 NOTE — Telephone Encounter (Signed)
Dr Alphonsus Sias assistant will talk to him about getting labs drawn for Dr. Arbutus Ped appointment.

## 2023-05-23 ENCOUNTER — Telehealth: Payer: Self-pay | Admitting: Internal Medicine

## 2023-05-23 NOTE — Telephone Encounter (Signed)
Patient is aware of follow up appointment times/dates

## 2023-06-06 ENCOUNTER — Other Ambulatory Visit: Payer: Self-pay

## 2023-06-06 ENCOUNTER — Other Ambulatory Visit: Payer: Self-pay | Admitting: *Deleted

## 2023-06-06 DIAGNOSIS — C4359 Malignant melanoma of other part of trunk: Secondary | ICD-10-CM

## 2023-06-07 ENCOUNTER — Inpatient Hospital Stay: Payer: BLUE CROSS/BLUE SHIELD | Attending: Physician Assistant | Admitting: Internal Medicine

## 2023-06-07 ENCOUNTER — Other Ambulatory Visit: Payer: Self-pay

## 2023-06-07 VITALS — BP 137/78 | HR 71 | Temp 98.0°F | Resp 16 | Wt 208.4 lb

## 2023-06-07 DIAGNOSIS — Z85820 Personal history of malignant melanoma of skin: Secondary | ICD-10-CM | POA: Diagnosis present

## 2023-06-07 DIAGNOSIS — Z08 Encounter for follow-up examination after completed treatment for malignant neoplasm: Secondary | ICD-10-CM | POA: Insufficient documentation

## 2023-06-07 DIAGNOSIS — C4359 Malignant melanoma of other part of trunk: Secondary | ICD-10-CM

## 2023-06-07 NOTE — Progress Notes (Signed)
[facility] [phone]   [phone]  OFFICE PROGRESS NOTE  Alysia Penna, MD [address] [place] [address]  DIAGNOSIS: Melanoma of unknown primary diagnosed in 2022.  He was found to have stage III presented with bulky left axilla lymphadenopathy.    PRIOR THERAPY: 1) He is status post biopsy with ultrasound guidance completed in January 2022 confirmed the presence of melanoma.   2) Ipilimumab 1 mg/kg and nivolumab 3 mg/kg neoadjuvant treatment started on January 14, 2021.  He completed 4 cycles of therapy with stable disease    3) He is status post surgical resection and lymph node dissection completed on August 22, 2021 at [facility].  The final pathology showed 1 out of 9 lymph nodes involved with malignancy.   4) Tafinlar 75mg  capsules, 2 capsules (150mg ) by mouth twice daily with  Mekinist 2 mg tablets,1 tablet by mouth once daily started on April 27, 2021.  Therapy discontinued in October 2022.   5) Radiation therapy to the left axilla completed in December 2022.  He received 48 Gray in 20 fractions.   CURRENT THERAPY: Observation   INTERVAL HISTORY: Ryan Chandler 57 y.o. male returns to the clinic today for follow-up visit.  The patient is feeling fine today with no concerning complaints except for darkening of his skin and intermittent itching.  He denied having any current chest pain, shortness of breath, cough or hemoptysis.  He has no nausea, vomiting, diarrhea or constipation.  He has no headache or visual changes.  He is here today for evaluation with repeat PET scan for restaging of his disease.  His PET scan was done at [facility] because of insurance coverage issues.  MEDICAL HISTORY: Past Medical History:  Diagnosis Date   Back pain    Colon polyps    Diabetes (HCC)    Diabetes mellitus without complication (HCC)    Headache    Hyperlipidemia    Hypertension    melanoma 10/2020   left  axilla   Neck pain     ALLERGIES:  has No Known Allergies.  MEDICATIONS:  Current Outpatient Medications  Medication Sig Dispense Refill   albuterol (VENTOLIN HFA) 108 (90 Base) MCG/ACT inhaler Inhale into the lungs. daily     aspirin 81 MG EC tablet      Blood Glucose Monitoring Suppl (ONE TOUCH ULTRA 2) w/Device KIT      empagliflozin (JARDIANCE) 25 MG TABS tablet Take 25 mg by mouth daily.     insulin glargine, 1 Unit Dial, (TOUJEO SOLOSTAR) 300 UNIT/ML Solostar Pen      lisinopril (PRINIVIL,ZESTRIL) 5 MG tablet Take 5 mg by mouth daily.     metFORMIN (GLUCOPHAGE) 1000 MG tablet Take 1,000 mg by mouth 2 (two) times daily.     ONE TOUCH CLUB LANCETS MISC      simvastatin (ZOCOR) 40 MG tablet Take 40 mg by mouth daily.     ANUCORT-HC 25 MG suppository Place 25 mg rectally daily as needed. As needed (Patient not taking: Reported on 06/07/2023)     Docusate Sodium (DSS) 100 MG CAPS daily (Patient not taking: Reported on 06/07/2023)     doxycycline (VIBRA-TABS) 100 MG tablet Take 1 tablet (100 mg total) by mouth 2 (two) times daily. (Patient not taking: Reported on 06/07/2023) 20 tablet 0   ergocalciferol (VITAMIN D2) 1.25 MG (50000 UT) capsule Take 1 capsule by mouth once a week. (Patient not taking: Reported on 08/08/2022)  HYDROcodone-acetaminophen (NORCO/VICODIN) 5-325 MG tablet Take 1 tablet by mouth every 6 (six) hours as needed. (Patient not taking: Reported on 06/07/2023)     hydrOXYzine (ATARAX/VISTARIL) 50 MG tablet Take 50 mg by mouth 3 (three) times daily as needed. (Patient not taking: Reported on 06/07/2023)     NON FORMULARY Diltiazem 2%/Lidocaine5% compound Use 3 x rectally daily for 2 months to heal anal fissure 30 g 1   polyethylene glycol powder (GLYCOLAX/MIRALAX) 17 GM/SCOOP powder Take 17 g by mouth daily. (Patient not taking: Reported on 06/07/2023) 255 g 0   prochlorperazine (COMPAZINE) 10 MG tablet Take 1 tablet (10 mg total) by mouth every 6 (six) hours as needed for  nausea or vomiting. (Patient not taking: Reported on 06/07/2023) 30 tablet 0   traMADol (ULTRAM) 50 MG tablet Take 50 mg by mouth 2 (two) times daily as needed. As needed (Patient not taking: Reported on 06/07/2023)     No current facility-administered medications for this visit.    SURGICAL HISTORY:  Past Surgical History:  Procedure Laterality Date   COLONOSCOPY     Melanoma removal  Left    no past surgery      REVIEW OF SYSTEMS:  A comprehensive review of systems was negative except for: Integument/breast: positive for pruritus and skin color change   PHYSICAL EXAMINATION: General appearance: alert, cooperative, fatigued, and no distress Head: Normocephalic, without obvious abnormality, atraumatic Neck: no adenopathy, no JVD, supple, symmetrical, trachea midline, and thyroid not enlarged, symmetric, no tenderness/mass/nodules Lymph nodes: Cervical, supraclavicular, and axillary nodes normal. Resp: clear to auscultation bilaterally Back: symmetric, no curvature. ROM normal. No CVA tenderness. Cardio: regular rate and rhythm, S1, S2 normal, no murmur, click, rub or gallop GI: soft, non-tender; bowel sounds normal; no masses,  no organomegaly Extremities: extremities normal, atraumatic, no cyanosis or edema  ECOG PERFORMANCE STATUS: 1 - Symptomatic but completely ambulatory  Blood pressure 137/78, pulse 71, temperature 98 F (36.7 C), temperature source Oral, resp. rate 16, weight 208 lb 6.4 oz (94.5 kg), SpO2 99%.  LABORATORY DATA: Lab Results  Component Value Date   WBC 5.9 07/19/2022   HGB 14.8 07/19/2022   HCT 45.0 07/19/2022   MCV 87.4 07/19/2022   PLT 233 07/19/2022      Chemistry      Component Value Date/Time   NA 136 07/19/2022 2212   K 3.9 07/19/2022 2212   CL 99 07/19/2022 2212   CO2 29 07/19/2022 2212   BUN 13 07/19/2022 2212   CREATININE 0.77 07/19/2022 2212   CREATININE 0.81 06/27/2022 0900      Component Value Date/Time   CALCIUM 9.3 07/19/2022 2212    ALKPHOS 60 06/27/2022 0900   AST 10 (L) 06/27/2022 0900   ALT 9 06/27/2022 0900   BILITOT 0.5 06/27/2022 0900       RADIOGRAPHIC STUDIES: No results found.  ASSESSMENT AND PLAN:  This is a very pleasant 57 years old [place] male with melanoma of unknown primary diagnosed in 2022 when he presented with stage III with bulky left axillary lymphadenopathy.  He is status post ultrasound-guided core biopsy of the axillary lymph node followed by neoadjuvant treatment with ipilimumab and nivolumab for 4 cycles followed by surgical resection and left axillary lymph node dissection completed on August 22, 2021 at [facility] with the final pathology showing 1 out of 9 lymph node involved with malignancy.  The patient also received radiotherapy to the left axilla completed in December 2022 and has been on  observation since that time.  He has significant lymphedema from his treatment.  He was seen by the lymphedema clinic and after the initial physical therapy he was advised to do it at home with instruction but he continues to have significant lymphedema and hard palpable area in the left axilla likely from the surgical scars.  The patient is currently on observation and he is feeling fine with no concerning complaints. He had a PET scan performed at [facility] that showed no evidence for disease recurrence or metastasis. I recommended for him to continue on observation with repeat PET scan in 6 months. For the itching he was advised to use Zyrtec or Claritin on as-needed basis. For the darkening of his skin, the patient was advised to limit his sun exposure. He was advised to call immediately if he has any other concerning symptoms in the interval. The patient voices understanding of current disease status and treatment options and is in agreement with the current care plan.  All questions were answered. The patient knows to call the clinic with any problems, questions or concerns. We  can certainly see the patient much sooner if necessary.  Disclaimer: This note was dictated with voice recognition software. Similar sounding words can inadvertently be transcribed and may not be corrected upon review.

## 2023-12-03 ENCOUNTER — Inpatient Hospital Stay: Payer: BLUE CROSS/BLUE SHIELD | Attending: Internal Medicine

## 2023-12-05 ENCOUNTER — Other Ambulatory Visit: Payer: Self-pay

## 2023-12-05 ENCOUNTER — Telehealth: Payer: Self-pay

## 2023-12-05 DIAGNOSIS — C4359 Malignant melanoma of other part of trunk: Secondary | ICD-10-CM

## 2023-12-05 NOTE — Telephone Encounter (Signed)
 Spoke with patient in regards to PET scan and appt with Dr. Marguerita Shih. Patient has appt with Dr. Marguerita Shih tomorrow but PET scan has not been done yet. Informed patient I will cancel appt with Dr. Marguerita Shih and we can reschedule once PET scan is completed. Patient request PET scan to be done at [facility] due to insurance purposes. Modified PET scan order and faxed order to [facility].

## 2023-12-06 ENCOUNTER — Ambulatory Visit: Payer: BLUE CROSS/BLUE SHIELD | Admitting: Internal Medicine

## 2023-12-11 ENCOUNTER — Telehealth: Payer: Self-pay | Admitting: Medical Oncology

## 2023-12-11 NOTE — Telephone Encounter (Signed)
[facility] needs auth number for PET scan.Message sent to Tanecia. Call or Fax auth number to ([phone]. (F) [phone].  Pt insurance is with [employer] ID #- [id number].[facility] has his insurance information.

## 2023-12-17 ENCOUNTER — Telehealth: Payer: Self-pay | Admitting: Medical Oncology

## 2023-12-17 NOTE — Telephone Encounter (Signed)
Elease Hashimoto called about appeal for PET /CT. She is reaching out to pt for consent to proceed.

## 2024-01-23 ENCOUNTER — Telehealth: Payer: Self-pay | Admitting: *Deleted

## 2024-01-23 ENCOUNTER — Encounter: Payer: Self-pay | Admitting: Internal Medicine

## 2024-01-23 NOTE — Telephone Encounter (Signed)
 Called pt to make aware that insurance denied scan approval. Per pt he has new insurance and request that new submission of scans be placed. Advised pt that insurance information is needed in order for the prior auth team to start on new scan approval. Pt stated, when he reaches home he will have his daughter to help upload new insurance information.

## 2024-01-25 ENCOUNTER — Telehealth: Payer: Self-pay | Admitting: Medical Oncology

## 2024-01-25 DIAGNOSIS — C4359 Malignant melanoma of other part of trunk: Secondary | ICD-10-CM

## 2024-01-25 NOTE — Addendum Note (Signed)
 Addended by: Charma Igo on: 01/25/2024 10:58 AM   Modules accepted: Orders

## 2024-01-25 NOTE — Telephone Encounter (Signed)
 PET scan authorized for [facility] - I instructed pt to contact central scheduling next Monday to schedule PET scan and let us know the date and we can schedule his labs before an df/u a few days later.

## 2024-01-25 NOTE — Telephone Encounter (Signed)
 PET scan authorized.

## 2024-01-28 ENCOUNTER — Telehealth: Payer: Self-pay | Admitting: Medical Oncology

## 2024-01-28 NOTE — Telephone Encounter (Signed)
 Confirmed PET scan appt.

## 2024-01-31 ENCOUNTER — Inpatient Hospital Stay: Attending: Internal Medicine

## 2024-01-31 ENCOUNTER — Encounter (HOSPITAL_COMMUNITY)
Admission: RE | Admit: 2024-01-31 | Discharge: 2024-01-31 | Disposition: A | Discharge status: Home / Self Care | Source: Ambulatory Visit | Attending: Internal Medicine | Admitting: Internal Medicine

## 2024-01-31 DIAGNOSIS — Z85820 Personal history of malignant melanoma of skin: Secondary | ICD-10-CM | POA: Insufficient documentation

## 2024-01-31 DIAGNOSIS — C4359 Malignant melanoma of other part of trunk: Secondary | ICD-10-CM | POA: Insufficient documentation

## 2024-01-31 LAB — CBC WITH DIFFERENTIAL (CANCER CENTER ONLY)
Abs Immature Granulocytes: 0.05 10*3/uL (ref 0.00–0.07)
Basophils Absolute: 0 10*3/uL (ref 0.0–0.1)
Basophils Relative: 0 %
Eosinophils Absolute: 0.4 10*3/uL (ref 0.0–0.5)
Eosinophils Relative: 6 %
HCT: 42.7 % (ref 39.0–52.0)
Hemoglobin: 14.3 g/dL (ref 13.0–17.0)
Immature Granulocytes: 1 %
Lymphocytes Relative: 15 %
Lymphs Abs: 0.9 10*3/uL (ref 0.7–4.0)
MCH: 28.5 pg (ref 26.0–34.0)
MCHC: 33.5 g/dL (ref 30.0–36.0)
MCV: 85.2 fL (ref 80.0–100.0)
Monocytes Absolute: 0.6 10*3/uL (ref 0.1–1.0)
Monocytes Relative: 11 %
Neutro Abs: 4 10*3/uL (ref 1.7–7.7)
Neutrophils Relative %: 67 %
Platelet Count: 229 10*3/uL (ref 150–400)
RBC: 5.01 MIL/uL (ref 4.22–5.81)
RDW: 12.3 % (ref 11.5–15.5)
WBC Count: 6 10*3/uL (ref 4.0–10.5)
nRBC: 0 % (ref 0.0–0.2)

## 2024-01-31 LAB — CMP (CANCER CENTER ONLY)
ALT: 12 U/L (ref 0–44)
AST: 11 U/L — ABNORMAL LOW (ref 15–41)
Albumin: 4.1 g/dL (ref 3.5–5.0)
Alkaline Phosphatase: 70 U/L (ref 38–126)
Anion gap: 5 (ref 5–15)
BUN: 10 mg/dL (ref 6–20)
CO2: 31 mmol/L (ref 22–32)
Calcium: 9 mg/dL (ref 8.9–10.3)
Chloride: 100 mmol/L (ref 98–111)
Creatinine: 0.74 mg/dL (ref 0.61–1.24)
GFR, Estimated: >60 mL/min (ref >60)
Glucose, Bld: 184 mg/dL — ABNORMAL HIGH (ref 70–99)
Potassium: 4.1 mmol/L (ref 3.5–5.1)
Sodium: 136 mmol/L (ref 135–145)
Total Bilirubin: 0.3 mg/dL (ref 0.0–1.2)
Total Protein: 7.3 g/dL (ref 6.5–8.1)

## 2024-01-31 LAB — GLUCOSE, CAPILLARY: Glucose-Capillary: 180 mg/dL — ABNORMAL HIGH (ref 70–99)

## 2024-01-31 LAB — LACTATE DEHYDROGENASE: LDH: 107 U/L (ref 98–192)

## 2024-01-31 MED ORDER — FLUDEOXYGLUCOSE F - 18 (FDG) INJECTION
10.9300 | Freq: Once | INTRAVENOUS | Status: AC
  Administered 2024-01-31: 10.93 via INTRAVENOUS

## 2024-02-06 ENCOUNTER — Telehealth: Payer: Self-pay

## 2024-02-06 NOTE — Telephone Encounter (Signed)
 Spoke with [name] from [facility] in regards to patients PET scan.  Informed [name] that Patient has new insurance and the PET was approved at [facility] and was completed on 01/31/2024.  [name] will cancel the PET for [facility].

## 2024-02-12 ENCOUNTER — Inpatient Hospital Stay (HOSPITAL_BASED_OUTPATIENT_CLINIC_OR_DEPARTMENT_OTHER): Admitting: Internal Medicine

## 2024-02-12 ENCOUNTER — Other Ambulatory Visit

## 2024-02-12 VITALS — BP 126/89 | HR 87 | Temp 100.0°F | Resp 17 | Wt 224.0 lb

## 2024-02-12 DIAGNOSIS — C4359 Malignant melanoma of other part of trunk: Secondary | ICD-10-CM | POA: Diagnosis not present

## 2024-02-12 DIAGNOSIS — Z85820 Personal history of malignant melanoma of skin: Secondary | ICD-10-CM | POA: Diagnosis not present

## 2024-02-12 NOTE — Progress Notes (Signed)
 [facility] Telephone:([phone]   Fax:([phone]  OFFICE PROGRESS NOTE  Alysia Penna, MD [address] [place] [address]  DIAGNOSIS: Melanoma of unknown primary diagnosed in 2022.  He was found to have stage III presented with bulky left axilla lymphadenopathy.    PRIOR THERAPY: 1) He is status post biopsy with ultrasound guidance completed in January 2022 confirmed the presence of melanoma.   2) Ipilimumab 1 mg/kg and nivolumab 3 mg/kg neoadjuvant treatment started on January 14, 2021.  He completed 4 cycles of therapy with stable disease    3) He is status post surgical resection and lymph node dissection completed on August 22, 2021 at [facility].  The final pathology showed 1 out of 9 lymph nodes involved with malignancy.   4) Tafinlar 75mg  capsules, 2 capsules (150mg ) by mouth twice daily with  Mekinist 2 mg tablets,1 tablet by mouth once daily started on April 27, 2021.  Therapy discontinued in October 2022.   5) Radiation therapy to the left axilla completed in December 2022.  He received 48 Gray in 20 fractions.   CURRENT THERAPY: Observation   INTERVAL HISTORY: Ryan Chandler 58 y.o. male returns to the clinic today for follow-up visit. Discussed the use of AI scribe software for clinical note transcription with the patient, who gave verbal consent to proceed.  History of Present Illness   Ryan Chandler is a 58 year old male with melanoma who presents for a full body check-up.  He has a history of melanoma diagnosed in January 2022. Initial treatment included immunotherapy with ipilimumab and nivolumab for four cycles, followed by surgical resection and lymph node dissection of the left axilla on August 22, 2021. Pathology revealed one out of nine lymph nodes involved with malignancy. He was subsequently started on dabrafenib and trametinib in June 2022, but this was discontinued in October 2022 due to intolerance. He  then received radiotherapy to the left axilla, completed in December 2022, and has been under observation since then.  He experiences lymphedema in the left arm following the lymph node dissection, which occasionally causes pain. The swelling has returned, and there is some discoloration in the area.  A recent PET scan conducted last week showed stable postoperative changes in the left axilla with no findings of recurrent chest wall tumor or metastatic disease. He also has stable gallbladder stones. Lab results were generally good, except for elevated blood sugar levels.       MEDICAL HISTORY: Past Medical History:  Diagnosis Date   Back pain    Colon polyps    Diabetes (HCC)    Diabetes mellitus without complication (HCC)    Headache    Hyperlipidemia    Hypertension    melanoma 10/2020   left axilla   Neck pain     ALLERGIES:  has no known allergies.  MEDICATIONS:  Current Outpatient Medications  Medication Sig Dispense Refill   albuterol (VENTOLIN HFA) 108 (90 Base) MCG/ACT inhaler Inhale into the lungs. daily     ANUCORT-HC 25 MG suppository Place 25 mg rectally daily as needed. As needed (Patient not taking: Reported on 06/07/2023)     aspirin 81 MG EC tablet      Blood Glucose Monitoring Suppl (ONE TOUCH ULTRA 2) w/Device KIT      Docusate Sodium (DSS) 100 MG CAPS daily (Patient not taking: Reported on 06/07/2023)     doxycycline (VIBRA-TABS) 100 MG tablet Take 1 tablet (100 mg  total) by mouth 2 (two) times daily. (Patient not taking: Reported on 06/07/2023) 20 tablet 0   empagliflozin (JARDIANCE) 25 MG TABS tablet Take 25 mg by mouth daily.     ergocalciferol (VITAMIN D2) 1.25 MG (50000 UT) capsule Take 1 capsule by mouth once a week. (Patient not taking: Reported on 08/08/2022)     HYDROcodone-acetaminophen (NORCO/VICODIN) 5-325 MG tablet Take 1 tablet by mouth every 6 (six) hours as needed. (Patient not taking: Reported on 06/07/2023)     hydrOXYzine (ATARAX/VISTARIL) 50 MG  tablet Take 50 mg by mouth 3 (three) times daily as needed. (Patient not taking: Reported on 06/07/2023)     insulin glargine, 1 Unit Dial, (TOUJEO SOLOSTAR) 300 UNIT/ML Solostar Pen      lisinopril (PRINIVIL,ZESTRIL) 5 MG tablet Take 5 mg by mouth daily.     metFORMIN (GLUCOPHAGE) 1000 MG tablet Take 1,000 mg by mouth 2 (two) times daily.     NON FORMULARY Diltiazem 2%/Lidocaine5% compound Use 3 x rectally daily for 2 months to heal anal fissure 30 g 1   ONE TOUCH CLUB LANCETS MISC      polyethylene glycol powder (GLYCOLAX/MIRALAX) 17 GM/SCOOP powder Take 17 g by mouth daily. (Patient not taking: Reported on 06/07/2023) 255 g 0   prochlorperazine (COMPAZINE) 10 MG tablet Take 1 tablet (10 mg total) by mouth every 6 (six) hours as needed for nausea or vomiting. (Patient not taking: Reported on 06/07/2023) 30 tablet 0   simvastatin (ZOCOR) 40 MG tablet Take 40 mg by mouth daily.     traMADol (ULTRAM) 50 MG tablet Take 50 mg by mouth 2 (two) times daily as needed. As needed (Patient not taking: Reported on 06/07/2023)     No current facility-administered medications for this visit.    SURGICAL HISTORY:  Past Surgical History:  Procedure Laterality Date   COLONOSCOPY     Melanoma removal  Left    no past surgery      REVIEW OF SYSTEMS:  Constitutional: negative Eyes: negative Ears, nose, mouth, throat, and face: negative Respiratory: negative Cardiovascular: negative Gastrointestinal: negative Genitourinary:negative Integument/breast: negative Hematologic/lymphatic: negative Musculoskeletal:negative Neurological: negative Behavioral/Psych: negative Endocrine: negative Allergic/Immunologic: negative   PHYSICAL EXAMINATION: General appearance: alert, cooperative, fatigued, and no distress Head: Normocephalic, without obvious abnormality, atraumatic Neck: no adenopathy, no JVD, supple, symmetrical, trachea midline, and thyroid not enlarged, symmetric, no tenderness/mass/nodules Lymph  nodes: Cervical, supraclavicular, and axillary nodes normal. Resp: clear to auscultation bilaterally Back: symmetric, no curvature. ROM normal. No CVA tenderness. Cardio: regular rate and rhythm, S1, S2 normal, no murmur, click, rub or gallop GI: soft, non-tender; bowel sounds normal; no masses,  no organomegaly Extremities: edema 1+ edema left upper extremity Neurologic: Alert and oriented X 3, normal strength and tone. Normal symmetric reflexes. Normal coordination and gait  ECOG PERFORMANCE STATUS: 1 - Symptomatic but completely ambulatory  Blood pressure 126/89, pulse 87, temperature 100 F (37.8 C), temperature source Temporal, resp. rate 17, weight 224 lb (101.6 kg), SpO2 99%.  LABORATORY DATA: Lab Results  Component Value Date   WBC 6.0 01/31/2024   HGB 14.3 01/31/2024   HCT 42.7 01/31/2024   MCV 85.2 01/31/2024   PLT 229 01/31/2024      Chemistry      Component Value Date/Time   NA 136 01/31/2024 1432   K 4.1 01/31/2024 1432   CL 100 01/31/2024 1432   CO2 31 01/31/2024 1432   BUN 10 01/31/2024 1432   CREATININE 0.74 01/31/2024 1432  Component Value Date/Time   CALCIUM 9.0 01/31/2024 1432   ALKPHOS 70 01/31/2024 1432   AST 11 (L) 01/31/2024 1432   ALT 12 01/31/2024 1432   BILITOT 0.3 01/31/2024 1432       RADIOGRAPHIC STUDIES: NM PET Image Restage (PS) Whole Body Result Date: 02/11/2024 CLINICAL DATA:  Subsequent treatment strategy for malignant melanoma. EXAM: NUCLEAR MEDICINE PET WHOLE BODY TECHNIQUE: 10.93 mCi F-18 FDG was injected intravenously. Full-ring PET imaging was performed from the head to foot after the radiotracer. CT data was obtained and used for attenuation correction and anatomic localization. Fasting blood glucose: 180  mg/dl COMPARISON:  prior PET-CT 12/21/2021. Most recent CT scan is 02/02/2023. FINDINGS: Mediastinal blood pool activity: SUV max 2.73 HEAD/NECK: No hypermetabolic activity in the scalp. No hypermetabolic cervical lymph nodes.  Incidental CT findings: none CHEST: No hypermetabolic mediastinal or hilar nodes. No suspicious pulmonary nodules on the CT scan. Surgical changes in the left axilla at the site of the patient's primary melanoma. There is a persistent 3.8 cm postoperative fluid collection likely liquified hematoma or seroma. Mild surrounding hypermetabolism likely postoperative granulation tissue. SUV max is 2.77 and unchanged. No hypermetabolic axillary nodes or evidence of recurrent chest wall tumor. Incidental CT findings: none ABDOMEN/PELVIS: No abnormal hypermetabolic activity within the liver, pancreas, adrenal glands, or spleen. No hypermetabolic lymph nodes in the abdomen or pelvis. Incidental CT findings: Stable cholelithiasis. Minimal scattered vascular calcifications. SKELETON: No focal hypermetabolic activity to suggest skeletal metastasis. Incidental CT findings: none EXTREMITIES: No abnormal hypermetabolic activity in the lower extremities. Incidental CT findings: none IMPRESSION: 1. Stable postoperative changes in the left axilla at the site of the patient's primary melanoma. No findings for recurrent chest wall tumor or metastatic disease. 2. Stable cholelithiasis. Electronically Signed   By: Rudie Meyer M.D.   On: 02/11/2024 20:17    ASSESSMENT AND PLAN:  This is a very pleasant 58 years old [place] male with melanoma of unknown primary diagnosed in 2022 when he presented with stage III with bulky left axillary lymphadenopathy.  He is status post ultrasound-guided core biopsy of the axillary lymph node followed by neoadjuvant treatment with ipilimumab and nivolumab for 4 cycles followed by surgical resection and left axillary lymph node dissection completed on August 22, 2021 at [facility] with the final pathology showing 1 out of 9 lymph node involved with malignancy.  The patient also received radiotherapy to the left axilla completed in December 2022 and has been on observation since that  time.  He has significant lymphedema from his treatment.  He was seen by the lymphedema clinic and after the initial physical therapy he was advised to do it at home with instruction but he continues to have significant lymphedema and hard palpable area in the left axilla likely from the surgical scars.  The patient is currently on observation and he is feeling fine with no concerning complaints.    Melanoma Melanoma diagnosed in January 2022, treated with ipilimumab, nivolumab, surgical resection, lymph node dissection, and radiotherapy. Pathology revealed one of nine lymph nodes with malignancy. Currently, no evidence of recurrent or metastatic disease on recent PET scan, which showed stable postoperative changes in the left axilla without recurrent chest wall tumor or metastatic disease. - Perform PET scan annually or as needed based on clinical findings - Instruct to report any new or concerning symptoms immediately  Lymphedema Lymphedema in the left arm secondary to lymph node dissection, with swelling and occasional pain. Managed with compression stockings. -  Advise continued use of compression stockings  Cholelithiasis Gallbladder stones identified on imaging. Asymptomatic.  Hyperglycemia Elevated blood glucose on recent lab work.  Follow-up Ongoing monitoring and follow-up to ensure no recurrence of melanoma and management of other conditions. - Schedule follow-up appointment in nine months - Order PET scan in nine months   He was advised to call immediately if he has any other concerning symptoms in the interval. The patient voices understanding of current disease status and treatment options and is in agreement with the current care plan.  All questions were answered. The patient knows to call the clinic with any problems, questions or concerns. We can certainly see the patient much sooner if necessary.  Disclaimer: This note was dictated with voice recognition software. Similar  sounding words can inadvertently be transcribed and may not be corrected upon review.

## 2024-11-03 ENCOUNTER — Encounter (HOSPITAL_COMMUNITY)
Admission: RE | Admit: 2024-11-03 | Discharge: 2024-11-03 | Disposition: A | Discharge status: Home / Self Care | Source: Ambulatory Visit | Attending: Internal Medicine | Admitting: Internal Medicine

## 2024-11-03 ENCOUNTER — Inpatient Hospital Stay: Attending: Internal Medicine

## 2024-11-03 ENCOUNTER — Other Ambulatory Visit

## 2024-11-03 DIAGNOSIS — C4359 Malignant melanoma of other part of trunk: Secondary | ICD-10-CM

## 2024-11-03 DIAGNOSIS — Z79899 Other long term (current) drug therapy: Secondary | ICD-10-CM | POA: Diagnosis not present

## 2024-11-03 DIAGNOSIS — E1165 Type 2 diabetes mellitus with hyperglycemia: Secondary | ICD-10-CM | POA: Diagnosis not present

## 2024-11-03 DIAGNOSIS — Z85820 Personal history of malignant melanoma of skin: Secondary | ICD-10-CM | POA: Insufficient documentation

## 2024-11-03 DIAGNOSIS — Z9226 Personal history of immune checkpoint inhibitor therapy: Secondary | ICD-10-CM | POA: Diagnosis not present

## 2024-11-03 LAB — CBC WITH DIFFERENTIAL (CANCER CENTER ONLY)
Abs Immature Granulocytes: 0.04 K/uL (ref 0.00–0.07)
Basophils Absolute: 0 K/uL (ref 0.0–0.1)
Basophils Relative: 1 %
Eosinophils Absolute: 0.4 K/uL (ref 0.0–0.5)
Eosinophils Relative: 7 %
HCT: 42.7 % (ref 39.0–52.0)
Hemoglobin: 14.3 g/dL (ref 13.0–17.0)
Immature Granulocytes: 1 %
Lymphocytes Relative: 17 %
Lymphs Abs: 1.1 K/uL (ref 0.7–4.0)
MCH: 28.2 pg (ref 26.0–34.0)
MCHC: 33.5 g/dL (ref 30.0–36.0)
MCV: 84.2 fL (ref 80.0–100.0)
Monocytes Absolute: 0.6 K/uL (ref 0.1–1.0)
Monocytes Relative: 10 %
Neutro Abs: 4.3 K/uL (ref 1.7–7.7)
Neutrophils Relative %: 64 %
Platelet Count: 235 K/uL (ref 150–400)
RBC: 5.07 MIL/uL (ref 4.22–5.81)
RDW: 13 % (ref 11.5–15.5)
WBC Count: 6.5 K/uL (ref 4.0–10.5)
nRBC: 0 % (ref 0.0–0.2)

## 2024-11-03 LAB — CMP (CANCER CENTER ONLY)
ALT: 13 U/L (ref 0–44)
AST: 16 U/L (ref 15–41)
Albumin: 4.3 g/dL (ref 3.5–5.0)
Alkaline Phosphatase: 78 U/L (ref 38–126)
Anion gap: 8 (ref 5–15)
BUN: 11 mg/dL (ref 6–20)
CO2: 29 mmol/L (ref 22–32)
Calcium: 9.4 mg/dL (ref 8.9–10.3)
Chloride: 99 mmol/L (ref 98–111)
Creatinine: 0.86 mg/dL (ref 0.61–1.24)
GFR, Estimated: >60 mL/min (ref >60)
Glucose, Bld: 244 mg/dL — ABNORMAL HIGH (ref 70–99)
Potassium: 4.6 mmol/L (ref 3.5–5.1)
Sodium: 136 mmol/L (ref 135–145)
Total Bilirubin: 0.2 mg/dL (ref 0.0–1.2)
Total Protein: 7.4 g/dL (ref 6.5–8.1)

## 2024-11-03 LAB — LACTATE DEHYDROGENASE: LDH: 133 U/L (ref 105–235)

## 2024-11-03 LAB — GLUCOSE, CAPILLARY: Glucose-Capillary: 238 mg/dL — ABNORMAL HIGH (ref 70–99)

## 2024-11-03 MED ORDER — FLUDEOXYGLUCOSE F - 18 (FDG) INJECTION
12.0000 | Freq: Once | INTRAVENOUS | Status: AC
  Administered 2024-11-03: 10:00:00 11.13 via INTRAVENOUS

## 2024-11-10 ENCOUNTER — Inpatient Hospital Stay: Admitting: Internal Medicine

## 2024-11-10 VITALS — BP 147/82 | HR 88 | Temp 97.4°F | Resp 17 | Ht 74.0 in | Wt 233.0 lb

## 2024-11-10 DIAGNOSIS — C4359 Malignant melanoma of other part of trunk: Secondary | ICD-10-CM | POA: Diagnosis not present

## 2024-11-10 NOTE — Progress Notes (Signed)
 "     Ryan Chandler [phone]   [phone]  OFFICE PROGRESS NOTE  Ryan Hamilton, MD [address] [place] [address]  DIAGNOSIS: Melanoma of unknown primary diagnosed in 2022.  He was found to have stage III presented with bulky left axilla lymphadenopathy.    PRIOR THERAPY: 1) He is status post biopsy with ultrasound guidance completed in January 2022 confirmed the presence of melanoma.   2) Ipilimumab  1 mg/kg and nivolumab  3 mg/kg neoadjuvant treatment started on January 14, 2021.  He completed 4 cycles of therapy with stable disease    3) He is status post surgical resection and lymph node dissection completed on August 22, 2021 at [facility].  The final pathology showed 1 out of 9 lymph nodes involved with malignancy.   4) Tafinlar  75mg  capsules, 2 capsules (150mg ) by mouth twice daily with  Mekinist  2 mg tablets,1 tablet by mouth once daily started on April 27, 2021.  Therapy discontinued in October 2022.   5) Radiation therapy to the left axilla completed in December 2022.  He received 48 Gray in 20 fractions.   CURRENT THERAPY: Observation   INTERVAL HISTORY: Ryan Chandler 59 y.o. male returns to the clinic today for follow-up visit. Discussed the use of AI scribe software for clinical note transcription with the patient, who gave verbal consent to proceed.  History of Present Illness Ryan Chandler is a 58 year old male with stage III melanoma of unknown primary, status post neoadjuvant immunotherapy and lymph node dissection, who presents for oncology surveillance and repeat PET scan.  He was diagnosed in 2022 with stage III melanoma of unknown primary, initially presenting with bulky left axillary lymphadenopathy. He underwent biopsy, four cycles of neoadjuvant ipilimumab  and nivolumab , followed by left axillary lymph node dissection in October 2022. Adjuvant dabrafenib and trametinib  were administered for four  months and discontinued in October 2022. He has been under surveillance since.  He continues to experience persistent left upper extremity swelling following his prior lymph node dissection. He also reports ongoing fatigue and decreased activity level, stating he does not engage in regular exercise or walking. He is currently on disability and not working. He denies chest pain or dyspnea. He notes a weight gain of over 20 pounds since his last visit.  He has poorly controlled type 2 diabetes mellitus, managed with two types of insulin and a glucose sensor device. Primary care is managed by Ryan Chandler.  He has developed areas of vitiligo, characterized by hypopigmented patches, and has a dermatology appointment scheduled for further evaluation.    MEDICAL HISTORY: Past Medical History:  Diagnosis Date   Back pain    Colon polyps    Diabetes (HCC)    Diabetes mellitus without complication (HCC)    Headache    Hyperlipidemia    Hypertension    melanoma 10/2020   left axilla   Neck pain     ALLERGIES:  has no known allergies.  MEDICATIONS:  Current Outpatient Medications  Medication Sig Dispense Refill   albuterol (VENTOLIN HFA) 108 (90 Base) MCG/ACT inhaler Inhale into the lungs. daily     ANUCORT-HC 25 MG suppository Place 25 mg rectally daily as needed. As needed (Patient not taking: Reported on 06/07/2023)     aspirin 81 MG EC tablet      Blood Glucose Monitoring Suppl (ONE TOUCH ULTRA 2) w/Device KIT      Docusate Sodium (DSS) 100 MG CAPS daily (Patient not taking: Reported  on 06/07/2023)     doxycycline  (VIBRA -TABS) 100 MG tablet Take 1 tablet (100 mg total) by mouth 2 (two) times daily. (Patient not taking: Reported on 06/07/2023) 20 tablet 0   empagliflozin (JARDIANCE) 25 MG TABS tablet Take 25 mg by mouth daily.     ergocalciferol (VITAMIN D2) 1.25 MG (50000 UT) capsule Take 1 capsule by mouth once a week. (Patient not taking: Reported on 08/08/2022)      HYDROcodone-acetaminophen (NORCO/VICODIN) 5-325 MG tablet Take 1 tablet by mouth every 6 (six) hours as needed. (Patient not taking: Reported on 06/07/2023)     hydrOXYzine (ATARAX/VISTARIL) 50 MG tablet Take 50 mg by mouth 3 (three) times daily as needed. (Patient not taking: Reported on 06/07/2023)     insulin glargine, 1 Unit Dial, (TOUJEO SOLOSTAR) 300 UNIT/ML Solostar Pen      lisinopril (PRINIVIL,ZESTRIL) 5 MG tablet Take 5 mg by mouth daily.     metFORMIN (GLUCOPHAGE) 1000 MG tablet Take 1,000 mg by mouth 2 (two) times daily.     NON FORMULARY Diltiazem 2%/Lidocaine5% compound Use 3 x rectally daily for 2 months to heal anal fissure 30 g 1   ONE TOUCH CLUB LANCETS MISC      polyethylene glycol powder (GLYCOLAX /MIRALAX ) 17 GM/SCOOP powder Take 17 g by mouth daily. (Patient not taking: Reported on 06/07/2023) 255 g 0   prochlorperazine  (COMPAZINE ) 10 MG tablet Take 1 tablet (10 mg total) by mouth every 6 (six) hours as needed for nausea or vomiting. (Patient not taking: Reported on 06/07/2023) 30 tablet 0   simvastatin (ZOCOR) 40 MG tablet Take 40 mg by mouth daily.     traMADol (ULTRAM) 50 MG tablet Take 50 mg by mouth 2 (two) times daily as needed. As needed (Patient not taking: Reported on 06/07/2023)     No current facility-administered medications for this visit.    SURGICAL HISTORY:  Past Surgical History:  Procedure Laterality Date   COLONOSCOPY     Melanoma removal  Left    no past surgery      REVIEW OF SYSTEMS:  A comprehensive review of systems was negative except for: Constitutional: positive for fatigue Respiratory: positive for dyspnea on exertion Integument/breast: positive for skin color change   PHYSICAL EXAMINATION: General appearance: alert, cooperative, fatigued, and no distress Head: Normocephalic, without obvious abnormality, atraumatic Neck: no adenopathy, no JVD, supple, symmetrical, trachea midline, and thyroid  not enlarged, symmetric, no  tenderness/mass/nodules Lymph nodes: Cervical, supraclavicular, and axillary nodes normal. Resp: clear to auscultation bilaterally Back: symmetric, no curvature. ROM normal. No CVA tenderness. Cardio: regular rate and rhythm, S1, S2 normal, no murmur, click, rub or gallop GI: soft, non-tender; bowel sounds normal; no masses,  no organomegaly Extremities: edema 1+ edema left upper extremity  ECOG PERFORMANCE STATUS: 1 - Symptomatic but completely ambulatory  Blood pressure (!) 141/81, pulse 88, temperature (!) 97.4 F (36.3 C), temperature source Temporal, resp. rate 17, height 6' 2 (1.88 m), weight 233 lb (105.7 kg), SpO2 98%.  LABORATORY DATA: Lab Results  Component Value Date   WBC 6.5 11/03/2024   HGB 14.3 11/03/2024   HCT 42.7 11/03/2024   MCV 84.2 11/03/2024   PLT 235 11/03/2024      Chemistry      Component Value Date/Time   NA 136 11/03/2024 0911   K 4.6 11/03/2024 0911   CL 99 11/03/2024 0911   CO2 29 11/03/2024 0911   BUN 11 11/03/2024 0911   CREATININE 0.86 11/03/2024 0911  Component Value Date/Time   CALCIUM 9.4 11/03/2024 0911   ALKPHOS 78 11/03/2024 0911   AST 16 11/03/2024 0911   ALT 13 11/03/2024 0911   BILITOT 0.2 11/03/2024 0911       RADIOGRAPHIC STUDIES: NM PET Image Restage (PS) Whole Body Result Date: 11/08/2024 CLINICAL DATA:  Subsequent treatment strategy for melanoma. EXAM: NUCLEAR MEDICINE PET WHOLE BODY TECHNIQUE: 11.1 mCi F-18 FDG was injected intravenously. Full-ring PET imaging was performed from the head to foot after the radiotracer. CT data was obtained and used for attenuation correction and anatomic localization. Fasting blood glucose: 238 mg/dl COMPARISON:  96/86/7974. FINDINGS: Mediastinal blood pool activity: SUV max 2.5 HEAD/NECK: Minimally hypermetabolic cervical lymph nodes with index left submandibular lymph node measuring 6 mm (4/66), SUV max 2.7, unchanged. Incidental CT findings: None. CHEST: No abnormal hypermetabolism.  Incidental CT findings: Atherosclerotic calcification of the aorta with age advanced involvement of the left anterior descending coronary artery. Heart is enlarged. No pericardial or pleural effusion. Postoperative seroma in the left axilla measures 2.5 x 3.9 cm, unchanged. ABDOMEN/PELVIS: No abnormal hypermetabolism. Incidental CT findings: Gallstones. SKELETON: No abnormal hypermetabolism. Incidental CT findings: Degenerative changes in the spine. EXTREMITIES: No abnormal hypermetabolism. Incidental CT findings: None. IMPRESSION: 1. No evidence of metastatic disease. 2. Minimally hypermetabolic small cervical lymph nodes, unchanged and possibly reactive in etiology. Recommend attention on follow-up. 3. Age advanced left anterior descending coronary artery calcification. 4. Cholelithiasis. 5.  Aortic atherosclerosis (ICD10-I70.0). Electronically Signed   By: Newell Eke M.D.   On: 11/08/2024 09:25    ASSESSMENT AND PLAN:  This is a very pleasant 58 years old [place] male with melanoma of unknown primary diagnosed in 2022 when he presented with stage III with bulky left axillary lymphadenopathy.  He is status post ultrasound-guided core biopsy of the axillary lymph node followed by neoadjuvant treatment with ipilimumab  and nivolumab  for 4 cycles followed by surgical resection and left axillary lymph node dissection completed on August 22, 2021 at [facility] with the final pathology showing 1 out of 9 lymph node involved with malignancy.  The patient also received radiotherapy to the left axilla completed in December 2022 and has been on observation since that time.  He has significant lymphedema from his treatment.  He was seen by the lymphedema clinic and after the initial physical therapy he was advised to do it at home with instruction but he continues to have significant lymphedema and hard palpable area in the left axilla likely from the surgical scars.  The patient is currently on  observation and he is feeling fine with no concerning complaints. He had repeat whole-body PET scan performed recently.  I personally and independently reviewed the scan and discussed the result with the patient today.  His scan showed no evidence of metastatic disease. Assessment and Plan Assessment & Plan Stage III melanoma of unknown primary, post-treatment, under surveillance He remains asymptomatic with no evidence of recurrent or metastatic disease on the most recent whole body PET scan. Performance status is ECOG 2, with limited activity and inability to work, but ambulatory and capable of self-care. He continues under surveillance following multimodal therapy, including neoadjuvant immunotherapy, surgical resection with lymph node dissection, and a short course of dabrafenib and trametinib . - Reviewed whole body PET scan results, confirming absence of disease recurrence or metastasis. - Recommended continued annual surveillance with whole body PET scan. - Scheduled follow-up in one year.  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  Vitiligo He has limited areas of depigmentation, possibly related to prior immunotherapy or radiation, without significant distress or functional impairment. Dermatology evaluation is pending. - Advised follow-up with dermatology for further evaluation and management. The patient was advised to call immediately if he has any other concerning symptoms in the interval.  The patient voices understanding of current disease status and treatment options and is in agreement with the current care plan.  All questions were answered. The patient knows to call the clinic with any problems, questions or concerns. We can certainly see the patient much sooner if necessary.  Disclaimer:  This note was dictated with voice recognition software. Similar sounding words can inadvertently be transcribed and may not be corrected upon review.        "

## 2025-11-11 ENCOUNTER — Inpatient Hospital Stay

## 2025-11-11 ENCOUNTER — Inpatient Hospital Stay: Admitting: Internal Medicine
